# Patient Record
Sex: Male | Born: 1975 | Race: Black or African American | Hispanic: No | Smoking: Current every day smoker
Health system: Southern US, Community
[De-identification: ages and names within clinical notes are randomized; demographics above are authoritative.]

## PROBLEM LIST (undated history)

## (undated) DIAGNOSIS — R51 Headache: Secondary | ICD-10-CM

## (undated) DIAGNOSIS — I10 Essential (primary) hypertension: Secondary | ICD-10-CM

## (undated) DIAGNOSIS — R519 Headache, unspecified: Secondary | ICD-10-CM

## (undated) HISTORY — DX: Headache: R51

## (undated) HISTORY — DX: Essential (primary) hypertension: I10

## (undated) HISTORY — DX: Headache, unspecified: R51.9

---

## 2000-01-28 ENCOUNTER — Emergency Department (HOSPITAL_COMMUNITY): Admission: EM | Admit: 2000-01-28 | Discharge: 2000-01-28 | Payer: Self-pay | Admitting: Emergency Medicine

## 2001-11-07 ENCOUNTER — Encounter: Payer: Self-pay | Admitting: Emergency Medicine

## 2001-11-07 ENCOUNTER — Emergency Department (HOSPITAL_COMMUNITY): Admission: EM | Admit: 2001-11-07 | Discharge: 2001-11-07 | Payer: Self-pay | Admitting: Emergency Medicine

## 2002-02-20 ENCOUNTER — Emergency Department (HOSPITAL_COMMUNITY): Admission: EM | Admit: 2002-02-20 | Discharge: 2002-02-20 | Payer: Self-pay | Admitting: Emergency Medicine

## 2011-11-04 ENCOUNTER — Other Ambulatory Visit: Payer: Self-pay

## 2011-11-04 ENCOUNTER — Emergency Department (HOSPITAL_COMMUNITY): Payer: PRIVATE HEALTH INSURANCE

## 2011-11-04 ENCOUNTER — Emergency Department (HOSPITAL_COMMUNITY)
Admission: EM | Admit: 2011-11-04 | Discharge: 2011-11-04 | Disposition: A | Discharge status: Home / Self Care | Payer: PRIVATE HEALTH INSURANCE | Attending: Emergency Medicine | Admitting: Emergency Medicine

## 2011-11-04 ENCOUNTER — Encounter (HOSPITAL_COMMUNITY): Payer: Self-pay | Admitting: *Deleted

## 2011-11-04 DIAGNOSIS — R209 Unspecified disturbances of skin sensation: Secondary | ICD-10-CM | POA: Insufficient documentation

## 2011-11-04 DIAGNOSIS — R29898 Other symptoms and signs involving the musculoskeletal system: Secondary | ICD-10-CM | POA: Insufficient documentation

## 2011-11-04 DIAGNOSIS — E876 Hypokalemia: Secondary | ICD-10-CM | POA: Insufficient documentation

## 2011-11-04 DIAGNOSIS — F17200 Nicotine dependence, unspecified, uncomplicated: Secondary | ICD-10-CM | POA: Insufficient documentation

## 2011-11-04 DIAGNOSIS — R202 Paresthesia of skin: Secondary | ICD-10-CM

## 2011-11-04 DIAGNOSIS — R51 Headache: Secondary | ICD-10-CM

## 2011-11-04 DIAGNOSIS — M79609 Pain in unspecified limb: Secondary | ICD-10-CM | POA: Insufficient documentation

## 2011-11-04 LAB — BASIC METABOLIC PANEL
BUN: 13 mg/dL (ref 6–23)
CO2: 28 mEq/L (ref 19–32)
Calcium: 9.3 mg/dL (ref 8.4–10.5)
Chloride: 104 mEq/L (ref 96–112)
Creatinine, Ser: 1.23 mg/dL (ref 0.50–1.35)
GFR calc Af Amer: 87 mL/min — ABNORMAL LOW (ref >90)
GFR calc non Af Amer: 75 mL/min — ABNORMAL LOW (ref >90)
Glucose, Bld: 94 mg/dL (ref 70–99)
Potassium: 3.3 mEq/L — ABNORMAL LOW (ref 3.5–5.1)
Sodium: 140 mEq/L (ref 135–145)

## 2011-11-04 LAB — RAPID URINE DRUG SCREEN, HOSP PERFORMED
Amphetamines: NOT DETECTED
Barbiturates: NOT DETECTED
Benzodiazepines: NOT DETECTED
Tetrahydrocannabinol: NOT DETECTED

## 2011-11-04 LAB — CBC
HCT: 44.1 % (ref 39.0–52.0)
Hemoglobin: 15.2 g/dL (ref 13.0–17.0)
MCH: 29 pg (ref 26.0–34.0)
MCV: 84 fL (ref 78.0–100.0)
RBC: 5.25 MIL/uL (ref 4.22–5.81)
WBC: 5.6 10*3/uL (ref 4.0–10.5)

## 2011-11-04 LAB — CARDIAC PANEL(CRET KIN+CKTOT+MB+TROPI)
CK, MB: 3.4 ng/mL (ref 0.3–4.0)
Relative Index: 1.3 (ref 0.0–2.5)
Total CK: 272 U/L — ABNORMAL HIGH (ref 7–232)
Troponin I: <0.3 ng/mL (ref <0.30)

## 2011-11-04 MED ORDER — ACETAMINOPHEN 325 MG PO TABS
650.0000 mg | ORAL_TABLET | Freq: Once | ORAL | Status: AC
  Administered 2011-11-04: 650 mg via ORAL
  Filled 2011-11-04: qty 2

## 2011-11-04 MED ORDER — POTASSIUM CHLORIDE CRYS ER 20 MEQ PO TBCR
40.0000 meq | EXTENDED_RELEASE_TABLET | Freq: Once | ORAL | Status: AC
  Administered 2011-11-04: 40 meq via ORAL
  Filled 2011-11-04: qty 2

## 2011-11-04 NOTE — Discharge Instructions (Signed)
Foods Rich in Potassium The body needs potassium to:  Control blood pressure.   Keep the muscles healthy.   Keep the nervous system healthy.  Most foods contain potassium. Eating a variety of foods in the right amounts will help control the level of potassium in your body.  Food / Potassium (mg)  Apricots, dried,  cup / 378 mg   Apricots, raw, 1 cup halves / 401 mg   Avocado,  / 487 mg   Banana, 1 large / 487 mg   Beef, lean, round, 3 oz / 202 mg   Cantaloupe, 1 cup cubes / 427 mg   Dates, medjool, 5 whole / 835 mg   Ham, cured, 3 oz / 212 mg   Lentils, dried,  cup / 458 mg   Lima beans, frozen,  cup / 258 mg   Orange, 1 large / 333 mg   Orange juice, 1 cup / 443 mg   Peaches, dried,  cup / 398 mg   Peas, split, cooked,  cup / 355 mg   Potato, boiled, 1 medium / 515 mg   Prunes, dried, uncooked,  cup / 318 mg   Raisins,  cup / 309 mg   Salmon, pink, raw, 3 oz / 275 mg   Sardines, canned , 3 oz / 338 mg   Tomato, raw, 1 medium / 292 mg   Tomato juice, 6 oz / 417 mg   [place], 3 oz / 349 mg  Other Foods High in Potassium (greater than 250 mg):  Bran cereals and other bran products.   Milk (skim, 1%, 2%, whole).   Buttermilk.   Yogurt.   Nuts.   Dried fruits.   Cherries.   Sweet potatoes.   Oranges.   Baked Beans.   Broccoli.   Spinach.   Peanut butter.   Tofu.  Foods Lower in Potassium (less than 250 mg):  Pasta.   Rice.   Cottage cheese.   Cheddar cheese.   Apples.   Mango.   Grapes.   Grapefruit.   Pineapple.   Raspberries.   Strawberries.   Watermelon.   Green Beans.   Cabbage.   Carrots.   Cauliflower.   Celery.   Corn.    Mushrooms.   Onions.   Squash.   Eggs.  The list below tells you how big or small some common portion sizes are:  1 oz.........4 stacked dice.   3 oz........Marland KitchenDeck of cards.   1 tsp.......Marland KitchenTip of little finger.   1 tbs......Marland KitchenMarland KitchenThumb.   2 tbs.......Marland KitchenGolf  ball.    cup......Marland KitchenHalf of a fist.   1 cup.......Marland KitchenA fist.  Document Released: 02/25/2008 Document Revised: 05/21/2011 Document Reviewed: 01/22/2009 [facility] Patient Information 2012 [place], [place].    Headaches, Frequently Asked Questions MIGRAINE HEADACHES Q: What is migraine? What causes it? How can I treat it? A: Generally, migraine headaches begin as a dull ache. Then they develop into a constant, throbbing, and pulsating pain. You may experience pain at the temples. You may experience pain at the front or back of one or both sides of the head. The pain is usually accompanied by a combination of:  Nausea.   Vomiting.   Sensitivity to light and noise.  Some people (about 15%) experience an aura (see below) before an attack. The cause of migraine is believed to be chemical reactions in the brain. Treatment for migraine may include over-the-counter or prescription medications. It may also include self-help techniques. These include relaxation training and biofeedback.  Q:  What is an aura? A: About 15% of people with migraine get an "aura". This is a sign of neurological symptoms that occur before a migraine headache. You may see wavy or jagged lines, dots, or flashing lights. You might experience tunnel vision or blind spots in one or both eyes. The aura can include visual or auditory hallucinations (something imagined). It may include disruptions in smell (such as strange odors), taste or touch. Other symptoms include:  Numbness.   A "pins and needles" sensation.   Difficulty in recalling or speaking the correct word.  These neurological events may last as long as 60 minutes. These symptoms will fade as the headache begins. Q: What is a trigger? A: Certain physical or environmental factors can lead to or "trigger" a migraine. These include:  Foods.   Hormonal changes.   Weather.   Stress.  It is important to remember that triggers are different for everyone. To help  prevent migraine attacks, you need to figure out which triggers affect you. Keep a headache diary. This is a good way to track triggers. The diary will help you talk to your healthcare professional about your condition. Q: Does weather affect migraines? A: Bright sunshine, hot, humid conditions, and drastic changes in barometric pressure may lead to, or "trigger," a migraine attack in some people. But studies have shown that weather does not act as a trigger for everyone with migraines. Q: What is the link between migraine and hormones? A: Hormones start and regulate many of your body's functions. Hormones keep your body in balance within a constantly changing environment. The levels of hormones in your body are unbalanced at times. Examples are during menstruation, pregnancy, or menopause. That can lead to a migraine attack. In fact, about three quarters of all women with migraine report that their attacks are related to the menstrual cycle.  Q: Is there an increased risk of stroke for migraine sufferers? A: The likelihood of a migraine attack causing a stroke is very remote. That is not to say that migraine sufferers cannot have a stroke associated with their migraines. In persons under age 67, the most common associated factor for stroke is migraine headache. But over the course of a person's normal life span, the occurrence of migraine headache may actually be associated with a reduced risk of dying from cerebrovascular disease due to stroke.  Q: What are acute medications for migraine? A: Acute medications are used to treat the pain of the headache after it has started. Examples over-the-counter medications, NSAIDs, ergots, and triptans.  Q: What are the triptans? A: Triptans are the newest class of abortive medications. They are specifically targeted to treat migraine. Triptans are vasoconstrictors. They moderate some chemical reactions in the brain. The triptans work on receptors in your brain.  Triptans help to restore the balance of a neurotransmitter called serotonin. Fluctuations in levels of serotonin are thought to be a main cause of migraine.  Q: Are over-the-counter medications for migraine effective? A: Over-the-counter, or "OTC," medications may be effective in relieving mild to moderate pain and associated symptoms of migraine. But you should see your caregiver before beginning any treatment regimen for migraine.  Q: What are preventive medications for migraine? A: Preventive medications for migraine are sometimes referred to as "prophylactic" treatments. They are used to reduce the frequency, severity, and length of migraine attacks. Examples of preventive medications include antiepileptic medications, antidepressants, beta-blockers, calcium channel blockers, and NSAIDs (nonsteroidal anti-inflammatory drugs). Q: Why are anticonvulsants used to treat  migraine? A: During the past few years, there has been an increased interest in antiepileptic drugs for the prevention of migraine. They are sometimes referred to as "anticonvulsants". Both epilepsy and migraine may be caused by similar reactions in the brain.  Q: Why are antidepressants used to treat migraine? A: Antidepressants are typically used to treat people with depression. They may reduce migraine frequency by regulating chemical levels, such as serotonin, in the brain.  Q: What alternative therapies are used to treat migraine? A: The term "alternative therapies" is often used to describe treatments considered outside the scope of conventional Western medicine. Examples of alternative therapy include acupuncture, acupressure, and yoga. Another common alternative treatment is herbal therapy. Some herbs are believed to relieve headache pain. Always discuss alternative therapies with your caregiver before proceeding. Some herbal products contain arsenic and other toxins. TENSION HEADACHES Q: What is a tension-type headache? What  causes it? How can I treat it? A: Tension-type headaches occur randomly. They are often the result of temporary stress, anxiety, fatigue, or anger. Symptoms include soreness in your temples, a tightening band-like sensation around your head (a "vice-like" ache). Symptoms can also include a pulling feeling, pressure sensations, and contracting head and neck muscles. The headache begins in your forehead, temples, or the back of your head and neck. Treatment for tension-type headache may include over-the-counter or prescription medications. Treatment may also include self-help techniques such as relaxation training and biofeedback. CLUSTER HEADACHES Q: What is a cluster headache? What causes it? How can I treat it? A: Cluster headache gets its name because the attacks come in groups. The pain arrives with little, if any, warning. It is usually on one side of the head. A tearing or bloodshot eye and a runny nose on the same side of the headache may also accompany the pain. Cluster headaches are believed to be caused by chemical reactions in the brain. They have been described as the most severe and intense of any headache type. Treatment for cluster headache includes prescription medication and oxygen. SINUS HEADACHES Q: What is a sinus headache? What causes it? How can I treat it? A: When a cavity in the bones of the face and skull (a sinus) becomes inflamed, the inflammation will cause localized pain. This condition is usually the result of an allergic reaction, a tumor, or an infection. If your headache is caused by a sinus blockage, such as an infection, you will probably have a fever. An x-ray will confirm a sinus blockage. Your caregiver's treatment might include antibiotics for the infection, as well as antihistamines or decongestants.  REBOUND HEADACHES Q: What is a rebound headache? What causes it? How can I treat it? A: A pattern of taking acute headache medications too often can lead to a condition  known as "rebound headache." A pattern of taking too much headache medication includes taking it more than 2 days per week or in excessive amounts. That means more than the label or a caregiver advises. With rebound headaches, your medications not only stop relieving pain, they actually begin to cause headaches. Doctors treat rebound headache by tapering the medication that is being overused. Sometimes your caregiver will gradually substitute a different type of treatment or medication. Stopping may be a challenge. Regularly overusing a medication increases the potential for serious side effects. Consult a caregiver if you regularly use headache medications more than 2 days per week or more than the label advises. ADDITIONAL QUESTIONS AND ANSWERS Q: What is biofeedback? A: Biofeedback is  a self-help treatment. Biofeedback uses special equipment to monitor your body's involuntary physical responses. Biofeedback monitors:  Breathing.   Pulse.   Heart rate.   Temperature.   Muscle tension.   Brain activity.  Biofeedback helps you refine and perfect your relaxation exercises. You learn to control the physical responses that are related to stress. Once the technique has been mastered, you do not need the equipment any more. Q: Are headaches hereditary? A: Four out of five (80%) of people that suffer report a family history of migraine. Scientists are not sure if this is genetic or a family predisposition. Despite the uncertainty, a child has a 50% chance of having migraine if one parent suffers. The child has a 75% chance if both parents suffer.  Q: Can children get headaches? A: By the time they reach high school, most young people have experienced some type of headache. Many safe and effective approaches or medications can prevent a headache from occurring or stop it after it has begun.  Q: What type of doctor should I see to diagnose and treat my headache? A: Start with your primary caregiver.  Discuss his or her experience and approach to headaches. Discuss methods of classification, diagnosis, and treatment. Your caregiver may decide to recommend you to a headache specialist, depending upon your symptoms or other physical conditions. Having diabetes, allergies, etc., may require a more comprehensive and inclusive approach to your headache. The National Head[employer] Foundation will provide, upon request, a list of [facility] physician members in your state. Document Released: 11/29/2003 Document Revised: 05/21/2011 Document Reviewed: 05/08/2008 [facility] Patient Information 2012 [place], [place].   Paresthesia Paresthesia refers to an abnormal burning or prickling sensation. This sensation is generally felt in the hands, arms, legs, or feet. It may occur in any part of the body. The sensation arises spontaneously. It can begin without apparent stimulus. It is usually not painful. It may also be described as:  Tingling or numbness.   Skin crawling.   Buzzing.   Itching.  Most people have experienced temporary (transient) paresthesia at some time in their lives. CAUSES  Paresthesia occurs whenever inadvertent pressure is placed on a nerve and causes what many describe as a "pins and needles" feeling. The feeling quickly goes away once the pressure is relieved.  For some people, however, paresthesia can become a chronic condition caused by an underlying disorder. The underlying disorder can be from:  Traumatic and direct injury to nerves:   Broken neck.   Fractured skull.   Disorders affecting the central nervous system (the brain and spinal cord), such as:   Transverse myelitis.   Encephalitis.   Transient ischemic attack.   Multiple sclerosis.   Stroke.   A tumor or blood vessel problem such as an arteriovenous malformation pressed up against the brain or spinal cord.   A wide range of conditions can also damage peripheral nerves (peripheral neuropathy). Peripheral nerves are  nerves that are not part of the brain and spinal cord. These conditions include:   Diabetes.   Hypothyroidism.   Vitamin B12 deficiencies.   Alcoholism.   Heavy metal poisoning (lead, arsenic, and other metals).   Nerve entrapment syndromes, such as carpal tunnel syndrome.   Rheumatoid arthritis.   Systemic lupus erythematosus.  Paresthesia caused by peripheral neuropathy may be accompanied by pain. DIAGNOSIS  Diagnosis is based on determining the underlying condition causing the paresthetic sensations. Essential to the diagnosis are an individual's:  Medical history.   Physical examination.  Laboratory tests.  Your caregiver may order additional tests depending on the suspected cause of the paresthesia. TREATMENT  The appropriate treatment for paresthesia depends on accurate diagnosis of the underlying cause. PROGNOSIS  The prognosis for those with paresthesia depends on the severity of the sensations and the associated disorder(s). Document Released: 08/29/2002 Document Revised: 03/24/2011 Document Reviewed: 09/08/2005 [facility] Patient Information 2012 [place], [place].

## 2011-11-04 NOTE — ED Notes (Signed)
Pt reports numbness to left arm since this am, headache since yesterday (generalized). No facial droop, no arm drift, no slurred speech. Pt alert and oriented x 3. Vital signs stable.

## 2011-11-04 NOTE — ED Provider Notes (Cosign Needed)
History     CSN: [id number]  Arrival date & time 11/04/11  1026   First MD Initiated Contact with Patient 11/04/11 1204      Chief Complaint  Patient presents with  . Headache  . Arm Pain   patient does have a known history of asthma. He states he left work late last night, at 1:30 AM at that time. He began having what he described as a severe cramp in his left lower extremity. He then began having pains in his left upper extremity. He then began feeling tingling and weakness in those areas as well. The patient went home and tried to sleep, but he states he awoke feeling "hot" he then noted. Persistent headache in the right portion of his head which is described as throbbing, which apparently began with gradual onset. He has had no facial droop. No slurred speech. He has had no fever or neck pain. He denies any visual changes. He does note chronic "leg cramps." Over the past month. He's had no syncope and no recent illness or injury  (Consider location/radiation/quality/duration/timing/severity/associated sxs/prior treatment) HPI  Past Medical History  Diagnosis Date  . Asthma     History reviewed. No pertinent past surgical history.  No family history on file.  History  Substance Use Topics  . Smoking status: Current Everyday Smoker -- 1.0 packs/day    Types: Cigarettes  . Smokeless tobacco: Not on file  . Alcohol Use: Yes     drinks on weekends      Review of Systems  All other systems reviewed and are negative.    Allergies  Review of patient's allergies indicates no known allergies.  Home Medications  No current outpatient prescriptions on file.  BP 165/63  Pulse 106  Temp(Src) 98.2 F (36.8 C) (Oral)  Resp 16  Ht 5\' 8"  (1.727 m)  Wt 220 lb (99.791 kg)  BMI 33.45 kg/m2  SpO2 98%  Physical Exam  Nursing note and vitals reviewed. Constitutional: He is oriented to person, place, and time. He appears well-developed and well-nourished.  HENT:  Head:  Normocephalic and atraumatic.  Eyes: Conjunctivae and EOM are normal. Pupils are equal, round, and reactive to light.  Neck: Neck supple.  Cardiovascular: Normal rate and regular rhythm.  Exam reveals no gallop and no friction rub.   No murmur heard. Pulmonary/Chest: Breath sounds normal. He has no wheezes. He has no rales. He exhibits no tenderness.  Abdominal: Soft. Bowel sounds are normal. He exhibits no distension. There is no tenderness. There is no rebound and no guarding.  Musculoskeletal: Normal range of motion.  Neurological: He is alert and oriented to person, place, and time. No cranial nerve deficit. He exhibits normal muscle tone. Coordination normal.       Grip strength slightly decreased on the left.  Poor effort in grip strength. However, overall, appears to be normal There is no pronator drift, however, patient complains of pain in his left upper shoulder. When holding his arms outward and extended. Reflexes are equal and symmetric. No facial droop. Tongue protrudes the midline. Speech is fluent. Gait is normal as tested Finger to nose testing is equal on both sides and normal  Skin: Skin is warm and dry. No rash noted.  Psychiatric: He has a normal mood and affect.    ED Course  Procedures (including critical care time)   Labs Reviewed  CARDIAC PANEL(CRET KIN+CKTOT+MB+TROPI)  BASIC METABOLIC PANEL  CBC  URINE RAPID DRUG SCREEN (HOSP PERFORMED)  No results found.   No diagnosis found.    MDM  Patient is seen and examined, initial history and physical is completed. Evaluation initiated    Workup to include labs, chest x-ray, EKG, stat head CT for symptoms   Results for orders placed during the hospital encounter of 11/04/11  CARDIAC PANEL(CRET KIN+CKTOT+MB+TROPI)      Component Value Range   Total CK 272 (*) 7 - 232 (U/L)   CK, MB 3.4  0.3 - 4.0 (ng/mL)   Troponin I <0.30  <0.30 (ng/mL)   Relative Index 1.3  0.0 - 2.5   BASIC METABOLIC PANEL       Component Value Range   Sodium 140  135 - 145 (mEq/L)   Potassium 3.3 (*) 3.5 - 5.1 (mEq/L)   Chloride 104  96 - 112 (mEq/L)   CO2 28  19 - 32 (mEq/L)   Glucose, Bld 94  70 - 99 (mg/dL)   BUN 13  6 - 23 (mg/dL)   Creatinine, Ser 1.61  0.50 - 1.35 (mg/dL)   Calcium 9.3  8.4 - 09.6 (mg/dL)   GFR calc non Af Amer 75 (*) >90 (mL/min)   GFR calc Af Amer 87 (*) >90 (mL/min)  CBC      Component Value Range   WBC 5.6  4.0 - 10.5 (K/uL)   RBC 5.25  4.22 - 5.81 (MIL/uL)   Hemoglobin 15.2  13.0 - 17.0 (g/dL)   HCT 04.5  40.9 - 81.1 (%)   MCV 84.0  78.0 - 100.0 (fL)   MCH 29.0  26.0 - 34.0 (pg)   MCHC 34.5  30.0 - 36.0 (g/dL)   RDW 91.4  78.2 - 95.6 (%)   Platelets 129 (*) 150 - 400 (K/uL)  URINE RAPID DRUG SCREEN (HOSP PERFORMED)      Component Value Range   Opiates NONE DETECTED  NONE DETECTED    Cocaine NONE DETECTED  NONE DETECTED    Benzodiazepines NONE DETECTED  NONE DETECTED    Amphetamines NONE DETECTED  NONE DETECTED    Tetrahydrocannabinol NONE DETECTED  NONE DETECTED    Barbiturates NONE DETECTED  NONE DETECTED    Dg Chest 2 View  11/04/2011  *RADIOLOGY REPORT*  Clinical Data: Left arm pain and weakness.  Headache.  CHEST - 2 VIEW  Comparison: No priors.  Findings: Lung volumes are normal.  No consolidative airspace disease.  No pleural effusions.  No pneumothorax.  No pulmonary nodule or mass noted.  Pulmonary vasculature and the cardiomediastinal silhouette are within normal limits.  IMPRESSION: 1. No radiographic evidence of acute cardiopulmonary disease.  Original Report Authenticated By: Florencia Reasons, M.D.   Ct Head Wo Contrast  11/04/2011  *RADIOLOGY REPORT*  Clinical Data: Left-sided arm pain and weakness.  Headaches.  CT HEAD WITHOUT CONTRAST  Technique:  Contiguous axial images were obtained from the base of the skull through the vertex without contrast.  Comparison: No priors.  Findings: No acute intracranial abnormalities.  Specifically, no evidence of acute  infarction, mass, mass effect, hydrocephalus or abnormal intra or extra-axial fluid collections.  No displaced skull fractures.  Visualized paranasal sinuses and mastoids are well pneumatized.  IMPRESSION:  1.  No acute intracranial abnormalities.  The appearance of the brain is normal.  Original Report Authenticated By: Florencia Reasons, M.D.      Other than mild hypokalemia. Other lab tests are normal. CAT scan of the brain was normal. Chest x-ray was normal  Clinically, the patient's symptoms  are not consistent with a stroke. He really has more pain in his left shoulder when he tries to hold out and had some cramping in his left leg. However, he had no focal, motor weakness. He had no slurred speech. No facial droop. The headache was easily relieved with Tylenol. Patient has ambulated in the ED without any difficulty.    Phone consultation with a neurologist, who also agrees that the patient can go home. Patient will be instructed to followup with his primary care doctor this week or to return to the ED for any concerns or changing symptoms         Davinder Haff A. Patrica Duel, MD 11/04/11 1601  Theron Arista A. Patrica Duel, MD 11/04/11 1606

## 2013-03-23 IMAGING — CT CT HEAD W/O CM
2 series · 17 of 30 positions shown, 20 images · non-contrast
Comparison: No priors.

CLINICAL DATA: Left-sided arm pain and weakness.  Headaches.

CT HEAD WITHOUT CONTRAST
TECHNIQUE: Contiguous axial images were obtained from the base of
the skull through the vertex without contrast.

[Series 2: head w/o · axial · non-contrast · 0.43mm/px · z∈[-107,+13]mm · 9 of 31 slices shown, 12 images]
[im 4/31  brain]
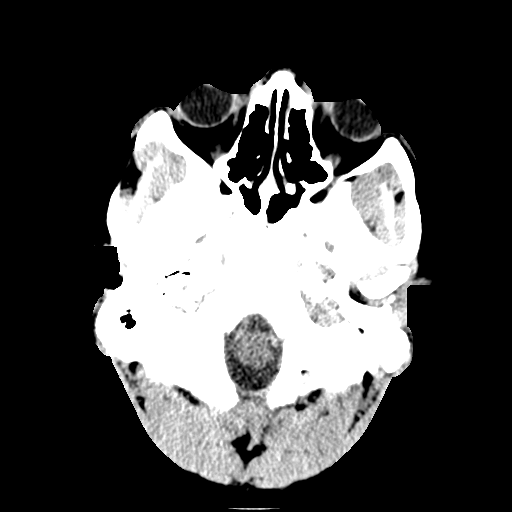
[im 4/31  bone]
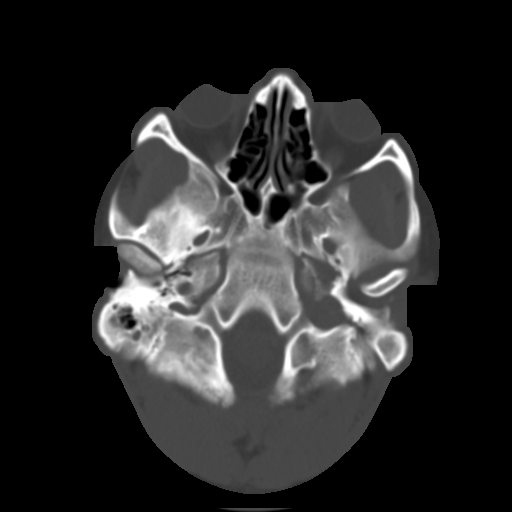
[im 7/31  brain]
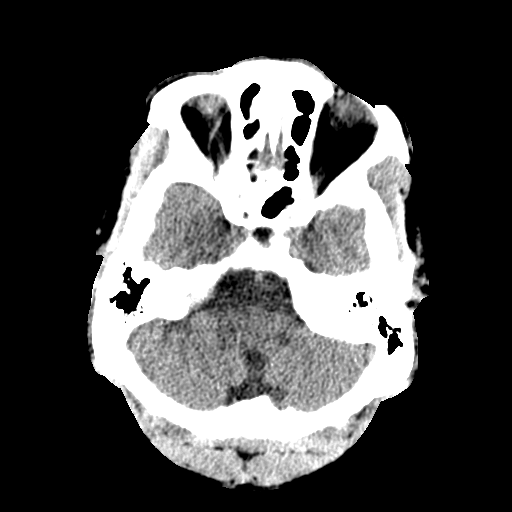
[im 10/31  brain]
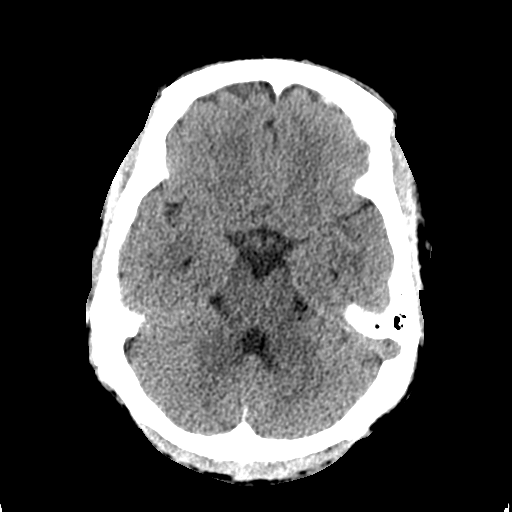
[im 13/31  brain]
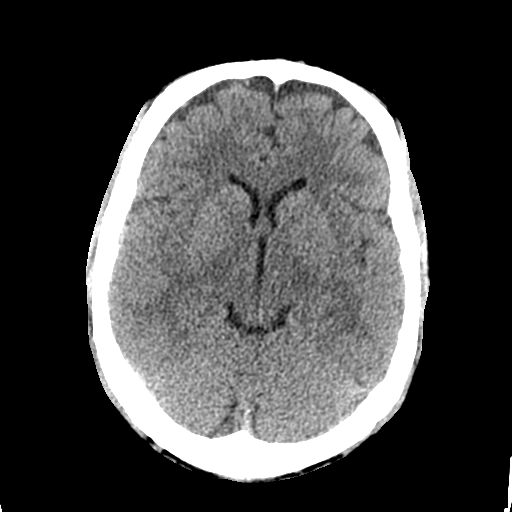
[im 16/31  brain]
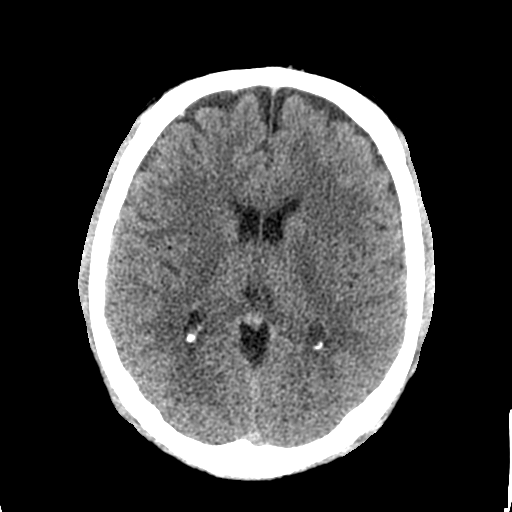
[im 16/31  bone]
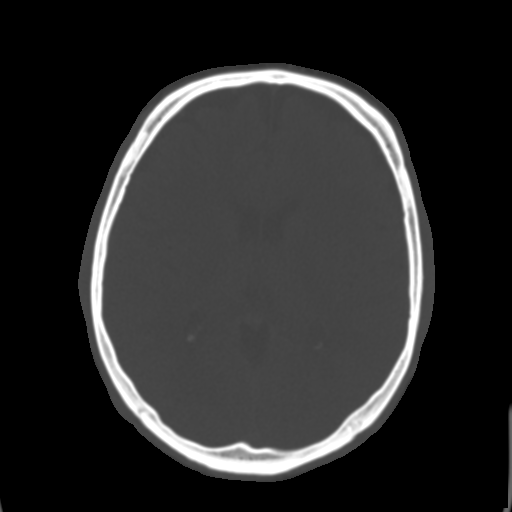
[im 19/31  brain]
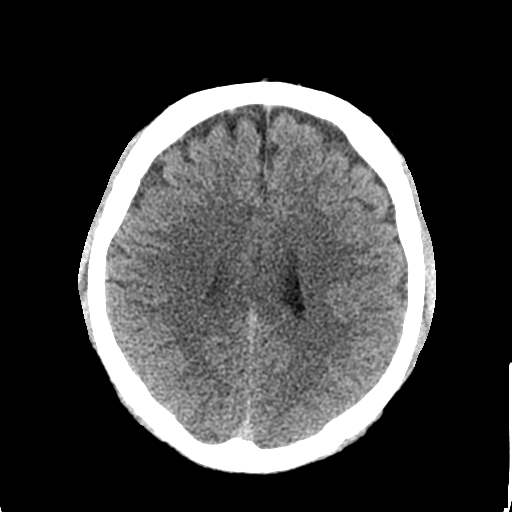
[im 22/31  brain]
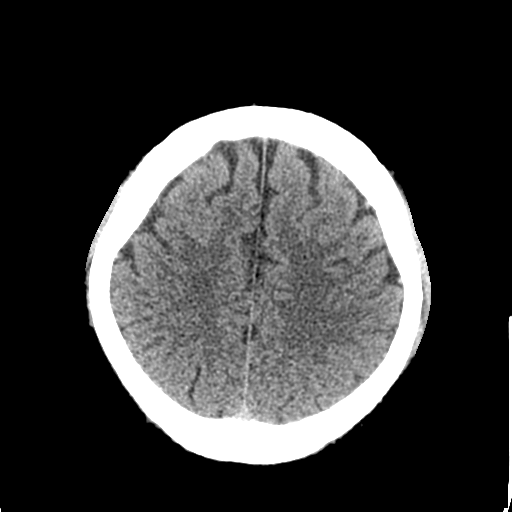
[im 25/31  brain]
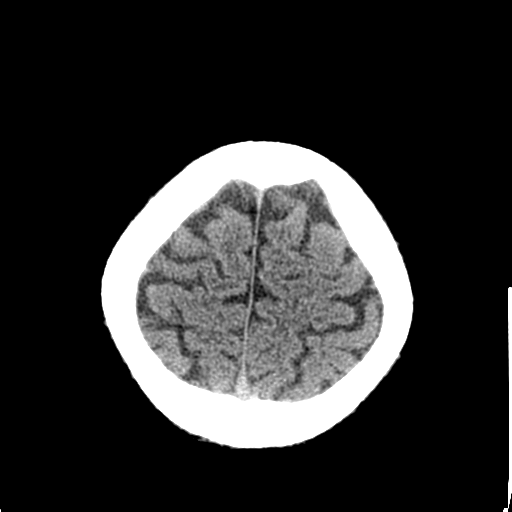
[im 28/31  brain]
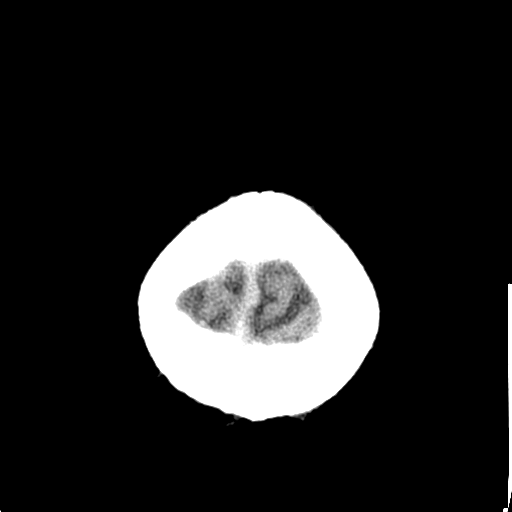
[im 28/31  bone]
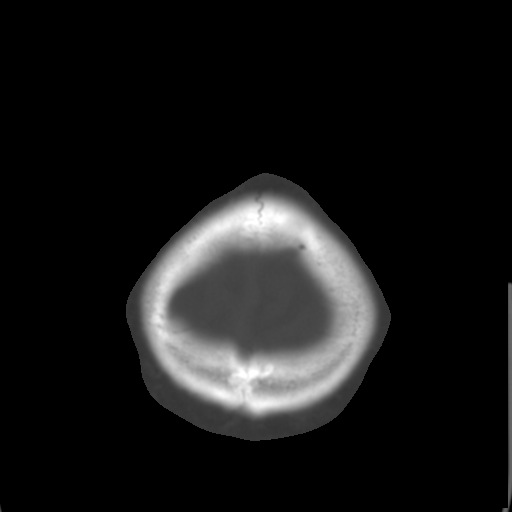

[Series 3: bone windows · axial · 0.43mm/px · z∈[-107,+10]mm · 8 of 51 slices shown]
[im 6/51  bone]
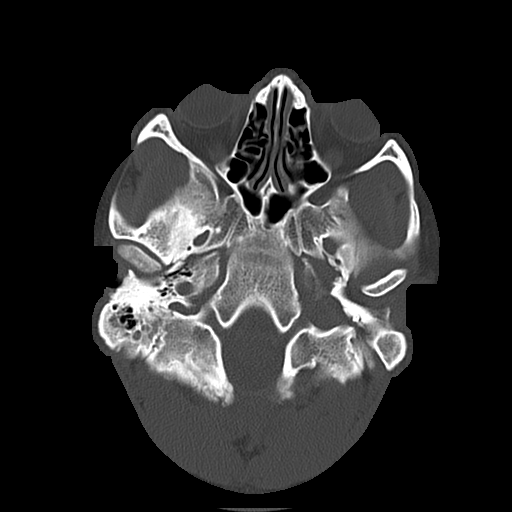
[im 12/51  bone]
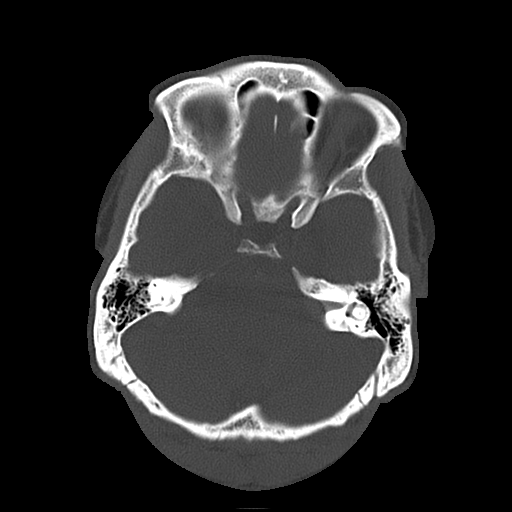
[im 17/51  bone]
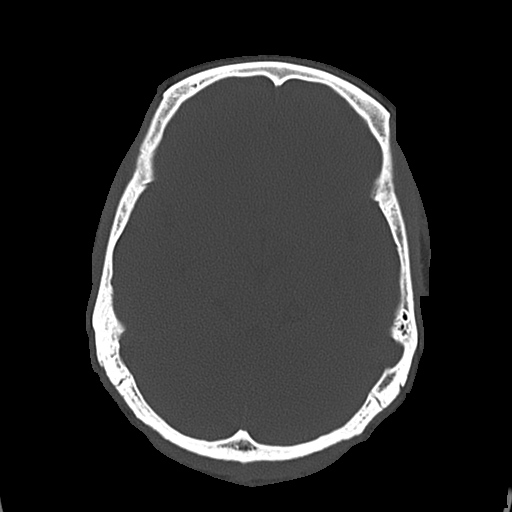
[im 23/51  bone]
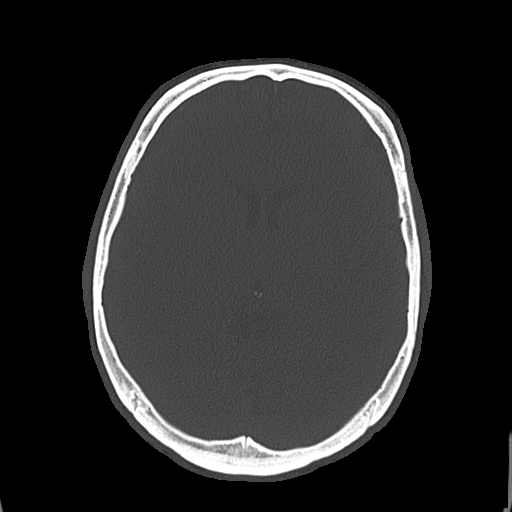
[im 28/51  bone]
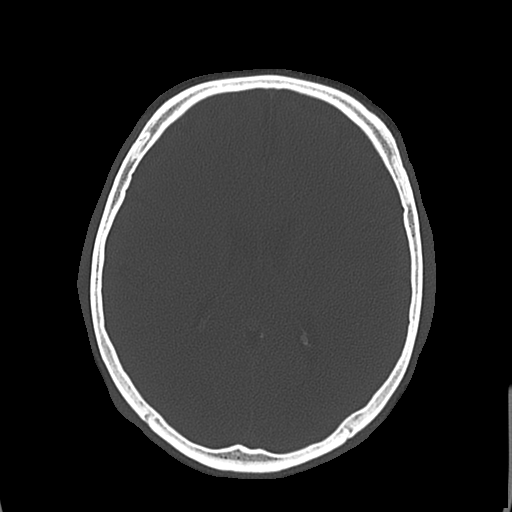
[im 34/51  bone]
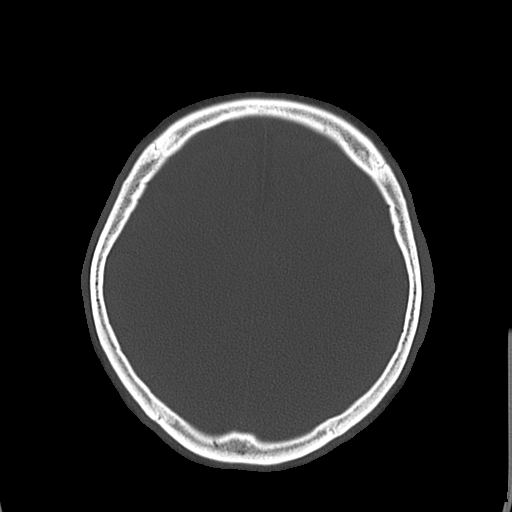
[im 39/51  bone]
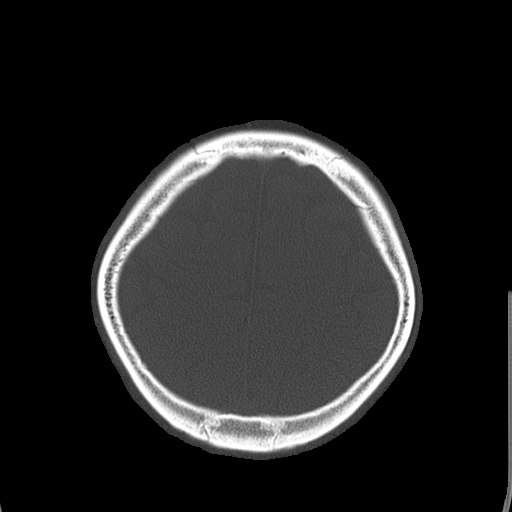
[im 45/51  bone]
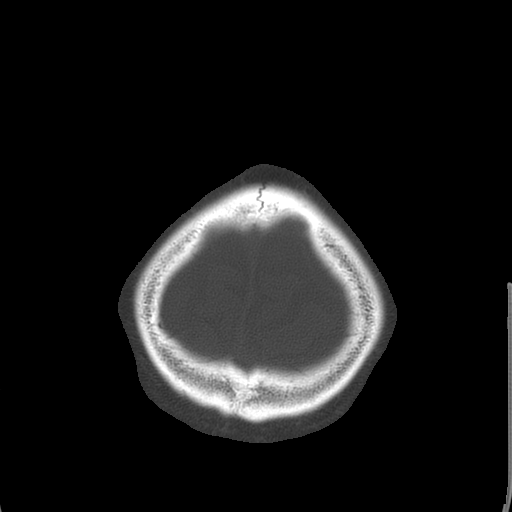

[17 of 30 positions shown; findings below may reference images not displayed]

FINDINGS: No acute intracranial abnormalities.  Specifically, no
evidence of acute infarction, mass, mass effect, hydrocephalus or
abnormal intra or extra-axial fluid collections.  No displaced
skull fractures.  Visualized paranasal sinuses and mastoids are
well pneumatized.
IMPRESSION: 1.  No acute intracranial abnormalities.  The appearance of the
brain is normal.

## 2013-03-23 IMAGING — CR DG CHEST 2V
2 series · 2 of 2 positions shown · non-contrast
Comparison: No priors.

CLINICAL DATA: Left arm pain and weakness.  Headache.

CHEST - 2 VIEW

[w chest pa]
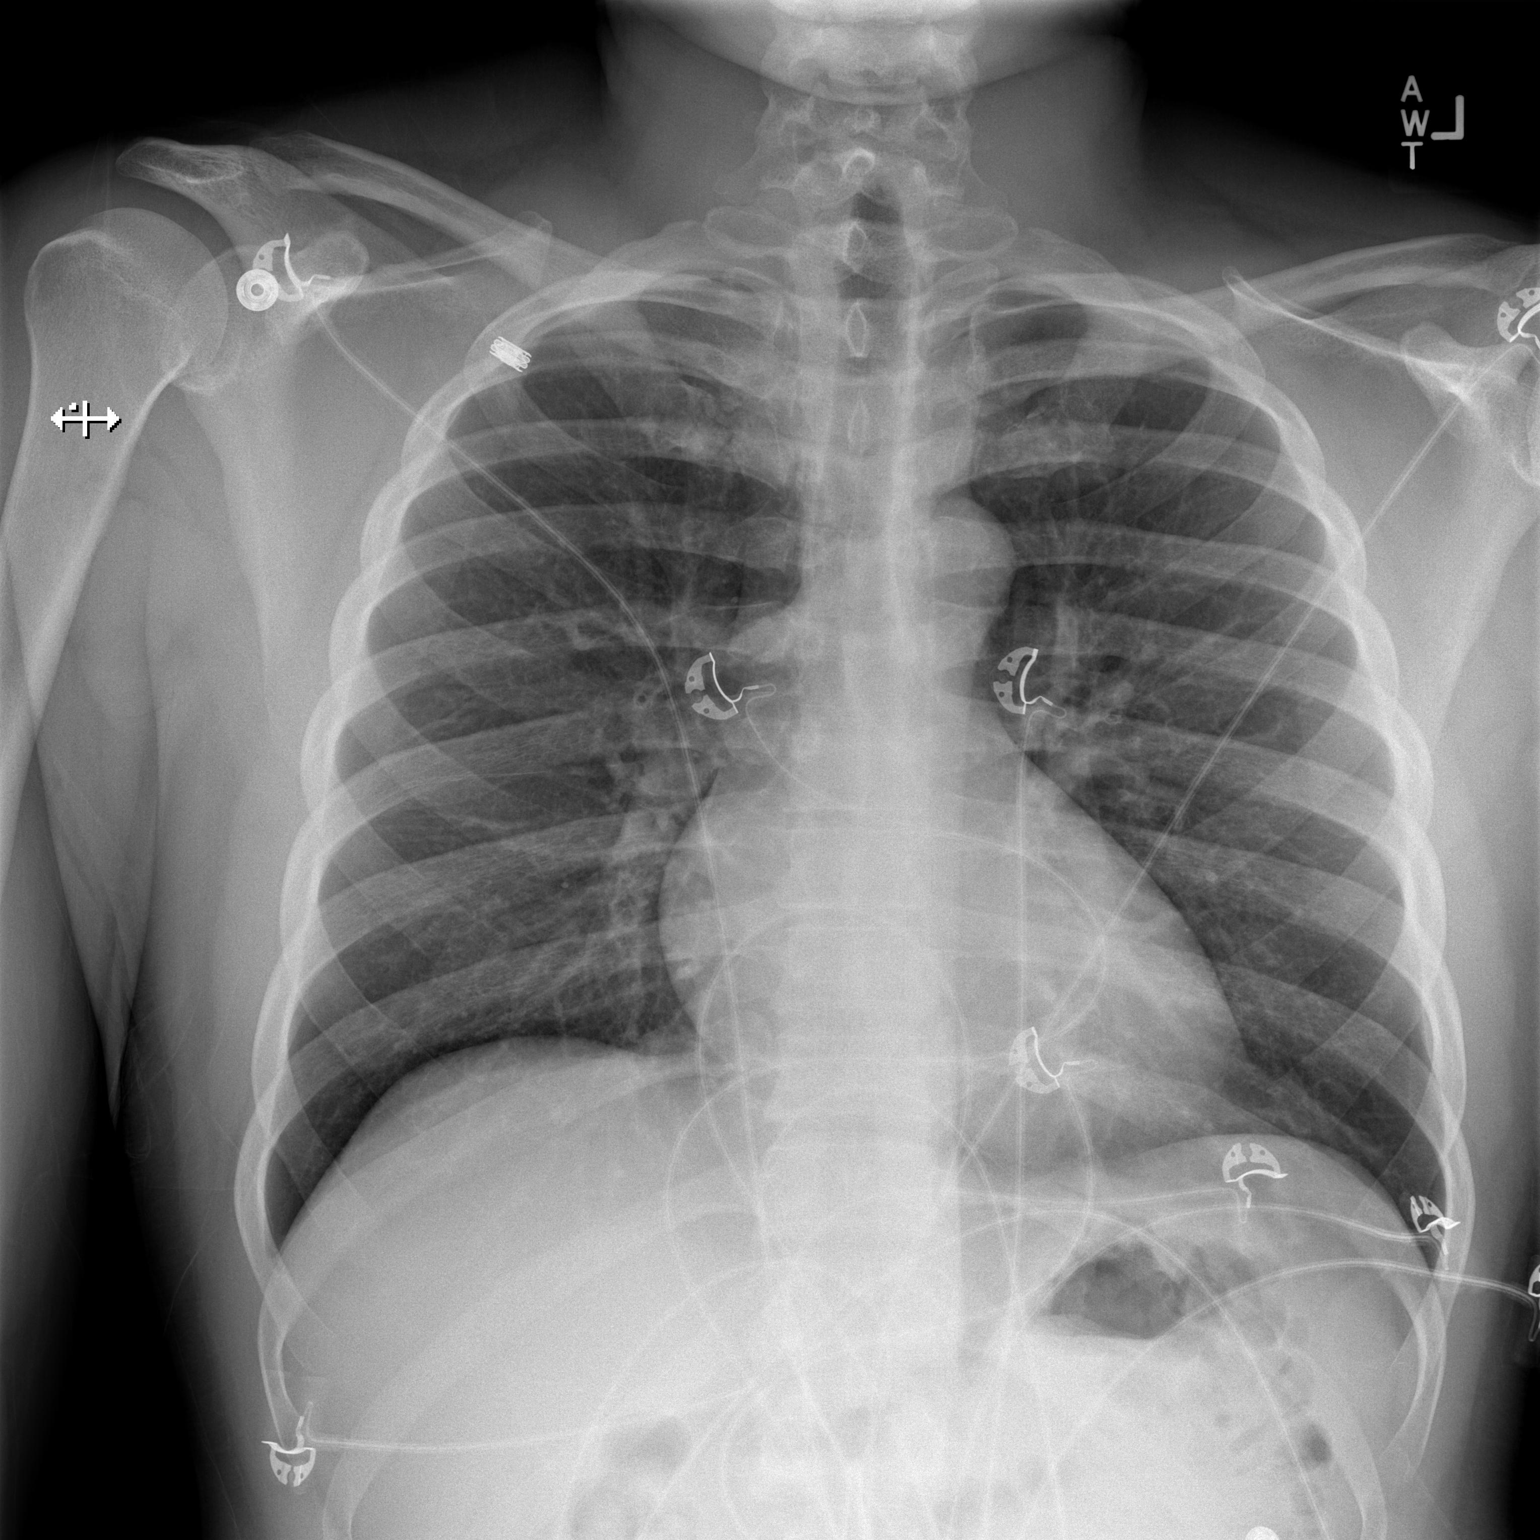

[w chest lat]
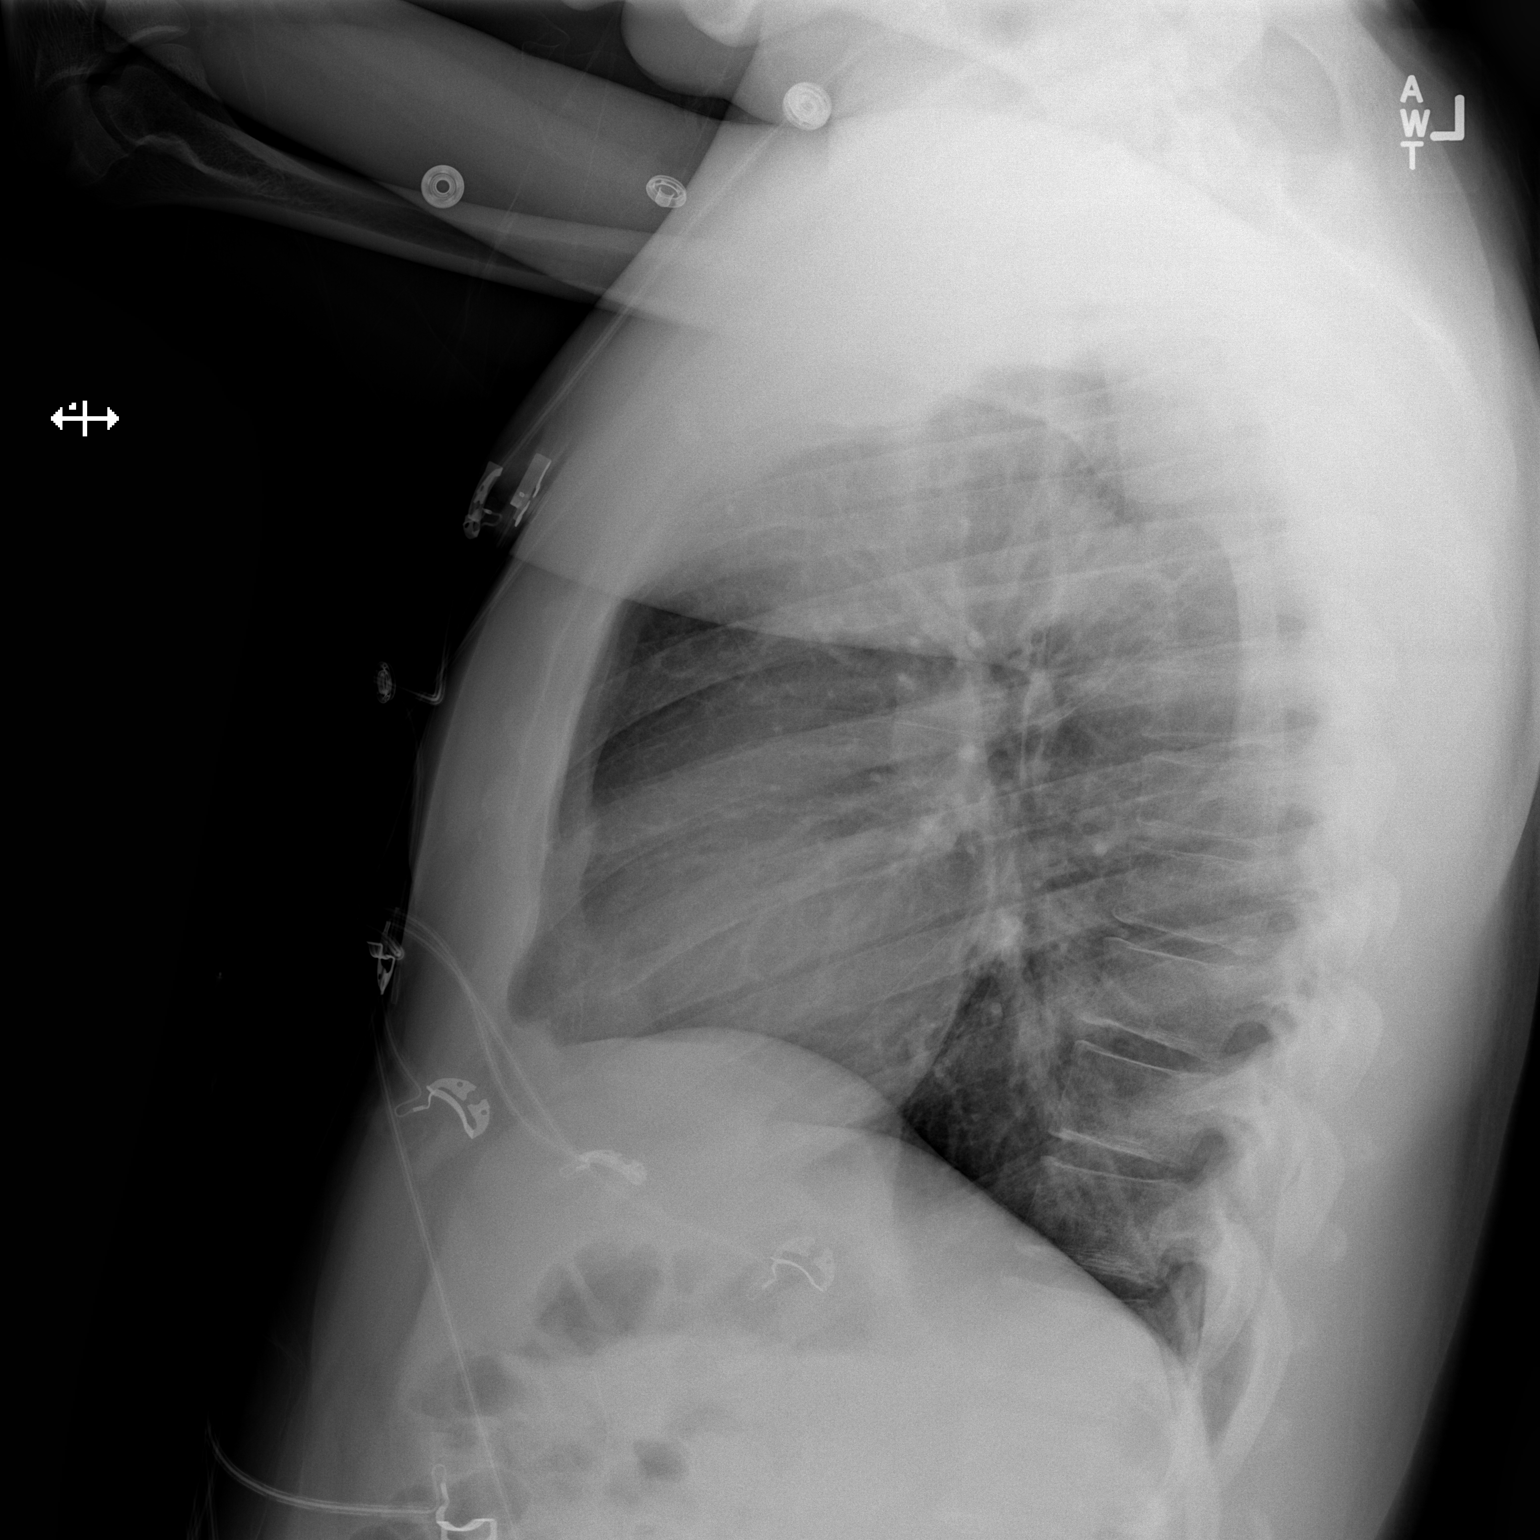

[2 of 2 positions shown; findings below may reference images not displayed]

FINDINGS: Lung volumes are normal.  No consolidative airspace
disease.  No pleural effusions.  No pneumothorax.  No pulmonary
nodule or mass noted.  Pulmonary vasculature and the
cardiomediastinal silhouette are within normal limits.
IMPRESSION: 1. No radiographic evidence of acute cardiopulmonary disease.

## 2016-07-16 ENCOUNTER — Encounter: Payer: Self-pay | Admitting: Diagnostic Neuroimaging

## 2016-07-16 ENCOUNTER — Ambulatory Visit (INDEPENDENT_AMBULATORY_CARE_PROVIDER_SITE_OTHER): Payer: BLUE CROSS/BLUE SHIELD | Admitting: Diagnostic Neuroimaging

## 2016-07-16 VITALS — BP 152/102 | HR 61 | Ht 68.0 in | Wt 249.0 lb

## 2016-07-16 DIAGNOSIS — R51 Headache: Secondary | ICD-10-CM

## 2016-07-16 DIAGNOSIS — G43009 Migraine without aura, not intractable, without status migrainosus: Secondary | ICD-10-CM | POA: Diagnosis not present

## 2016-07-16 DIAGNOSIS — H4710 Unspecified papilledema: Secondary | ICD-10-CM

## 2016-07-16 DIAGNOSIS — R0683 Snoring: Secondary | ICD-10-CM | POA: Diagnosis not present

## 2016-07-16 DIAGNOSIS — Z6837 Body mass index (BMI) 37.0-37.9, adult: Secondary | ICD-10-CM | POA: Diagnosis not present

## 2016-07-16 DIAGNOSIS — G4719 Other hypersomnia: Secondary | ICD-10-CM

## 2016-07-16 DIAGNOSIS — R519 Headache, unspecified: Secondary | ICD-10-CM

## 2016-07-16 DIAGNOSIS — I10 Essential (primary) hypertension: Secondary | ICD-10-CM | POA: Diagnosis not present

## 2016-07-16 MED ORDER — TOPIRAMATE 50 MG PO TABS: 50.0000 mg | ORAL_TABLET | Freq: Two times a day (BID) | ORAL | 12 refills | Status: DC

## 2016-07-16 NOTE — Patient Instructions (Signed)
Thank you for coming to see [place] at [facility]. I hope we have been able to provide you high quality care today.  You may receive a patient satisfaction survey over the next few weeks. We would appreciate your feedback and comments so that we may continue to improve ourselves and the health of our patients.  - I will check MRI brain and sleep study  - start topiramate '50mg'$  at bedtime; after 1 week increase to twice a day; drink plenty of water   ~~~~~~~~~~~~~~~~~~~~~~~~~~~~~~~~~~~~~~~~~~~~~~~~~~~~~~~~~~~~~~~~~  DR. Homar Weinkauf'S GUIDE TO HAPPY AND HEALTHY LIVING These are some of my general health and wellness recommendations. Some of them may apply to you better than others. Please use common sense as you try these suggestions and feel free to ask me any questions.   ACTIVITY/FITNESS Mental, social, emotional and physical stimulation are very important for brain and body health. Try learning a new activity (arts, music, language, sports, games).  Keep moving your body to the best of your abilities. You can do this at home, inside or outside, the park, community center, gym or anywhere you like. Consider a physical therapist or personal trainer to get started. Consider the app Sworkit. Fitness trackers such as smart-watches, smart-phones or Fitbits can help as well.   NUTRITION Eat more plants: colorful vegetables, nuts, seeds and berries.  Eat less sugar, salt, preservatives and processed foods.  Avoid toxins such as cigarettes and alcohol.  Drink water when you are thirsty. Warm water with a slice of lemon is an excellent morning drink to start the day.  Consider these websites for more information The Nutrition Source (https://www.henry-hernandez.biz/) Precision Nutrition (WindowBlog.ch)   RELAXATION Consider practicing mindfulness meditation or other relaxation techniques such as deep breathing, prayer, yoga, tai chi,  massage. See website mindful.org or the apps Headspace or Calm to help get started.   SLEEP Try to get at least 7-8+ hours sleep per day. Regular exercise and reduced caffeine will help you sleep better. Practice good sleep hygeine techniques. See website sleep.org for more information.   PLANNING Prepare estate planning, living will, healthcare POA documents. Sometimes this is best planned with the help of an attorney. Theconversationproject.org and agingwithdignity.org are excellent resources.

## 2016-07-16 NOTE — Progress Notes (Signed)
[facility]  PATIENT: Leonard Carter Carter DOB: 07/23/1976  REFERRING CLINICIAN: Allene Carter, OD HISTORY FROM: patient  REASON FOR VISIT: new consult    HISTORICAL  CHIEF COMPLAINT:  Chief Complaint  Patient presents with  . Bilateral optic disk edema, papilledema    rm 6, New Pt, "blurry vision x 1 year"    HISTORY OF PRESENT ILLNESS:   40 year old right-handed male here for valuation of papilledema and blurred vision. For past 1 year patient has had gradual and progressive blurred vision. Symptoms are worse lately. He also is having increasing headaches in the middle of his head, throbbing sensation with photophobia. Headaches can last hours a time. Patient has tried ibuprofen without relief. Headaches were once per month and now once per week.  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  Patient also has had weight gain from 210 up to 245 pounds over the past 5 years.   REVIEW OF SYSTEMS: Full 14 system review of systems performed and negative with exception of: Swelling in legs itching snoring shortness of breath blurred vision increased thirst joint pain aching muscles skin sensitivity headache numbness.   ALLERGIES: No Known Allergies  HOME MEDICATIONS: No outpatient prescriptions prior to visit.   No facility-administered medications prior to visit.     PAST MEDICAL HISTORY: Past Medical History:  Diagnosis Date  . Asthma     PAST SURGICAL HISTORY: History reviewed. No pertinent surgical history.  FAMILY HISTORY: Family History  Problem Relation Age of Onset  . Ulcers Father     SOCIAL HISTORY:  Social History   Social History  . Marital status: Married    Spouse name: N/A  . Number of children: 4  . Years of education: 15   Occupational History  .      Leonard Carter [employer]   Social History Main Topics  . Smoking status: Current Every Day Smoker    Packs/day: 1.00    Types:  Cigarettes  . Smokeless tobacco: Never Used     Comment: 07/16/16 down to 1/2 PPD  . Alcohol use Yes     Comment: drinks on weekends  . Drug use:     Types: Marijuana     Comment: 07/16/16 quit 3-4 yrs ago, did smoke recently on vacation  . Sexual activity: Not on file   Other Topics Concern  . Not on file   Social History Narrative  . No narrative on file     PHYSICAL EXAM  GENERAL EXAM/CONSTITUTIONAL: Vitals:  Vitals:   07/16/16 1027  BP: (!) 152/102  Pulse: 61  Weight: 249 lb (112.9 kg)  Height: 5\' 8"  (1.727 m)     Body mass index is 37.86 kg/m.  Visual Acuity Screening   Right eye Left eye Both eyes  Without correction: 20/30 20/30   With correction:        Patient is in no distress; well developed, nourished and groomed; neck is supple  CARDIOVASCULAR:  Examination of carotid arteries is normal; no carotid bruits  Regular rate and rhythm, no murmurs  Examination of peripheral vascular system by observation and palpation is normal  EYES:  Ophthalmoscopic exam of optic discs and posterior segments is normal; no hemorrhages  SCLERAL INJECTION (RIGHT GREATER THAN LEFT)  MUSCULOSKELETAL:  Gait, strength, tone, movements noted in Neurologic exam below  NEUROLOGIC: MENTAL STATUS:  No flowsheet data found.  awake, alert, oriented to person, place and time  recent and remote memory intact  normal attention and concentration  language fluent, comprehension intact, naming intact,   fund of knowledge appropriate  CRANIAL NERVE:   2nd - SLIGHTLY BLURRED OPTIC DISC MARGINS on fundoscopic exam  2nd, 3rd, 4th, 6th - pupils equal and reactive to light, visual fields full to confrontation, extraocular muscles intact, no nystagmus  5th - facial sensation symmetric  7th - facial strength symmetric  8th - hearing intact  9th - palate elevates symmetrically, uvula midline  11th - shoulder shrug symmetric  12th - tongue protrusion  midline  MOTOR:   normal bulk and tone, full strength in the BUE, BLE  SENSORY:   normal and symmetric to light touch, pinprick, temperature, vibration  COORDINATION:   finger-nose-finger, fine finger movements normal  REFLEXES:   deep tendon reflexes present and symmetric  GAIT/STATION:   narrow based gait; able to walk on toes, heels and tandem; romberg is negative    DIAGNOSTIC DATA (LABS, IMAGING, TESTING) - I reviewed patient records, labs, notes, testing and imaging myself where available.  Lab Results  Component Value Date   WBC 5.6 11/04/2011   HGB 15.2 11/04/2011   HCT 44.1 11/04/2011   MCV 84.0 11/04/2011   PLT 129 (L) 11/04/2011      Component Value Date/Time   NA 140 11/04/2011 1300   K 3.3 (L) 11/04/2011 1300   CL 104 11/04/2011 1300   CO2 28 11/04/2011 1300   GLUCOSE 94 11/04/2011 1300   BUN 13 11/04/2011 1300   CREATININE 1.23 11/04/2011 1300   CALCIUM 9.3 11/04/2011 1300   GFRNONAA 75 (L) 11/04/2011 1300   GFRAA 87 (L) 11/04/2011 1300   No results found for: CHOL, HDL, LDLCALC, LDLDIRECT, TRIG, CHOLHDL No results found for: ZOXW9UHGBA1C No results found for: VITAMINB12 No results found for: TSH  11/04/11 CT head [I reviewed images myself and agree with interpretation. -VRP]  - No acute intracranial abnormalities.  The appearance of [name] Carter is normal.     ASSESSMENT AND PLAN  40 y.o. year old male here with weight gain, hypertension, blurred vision, papilledema with increasing problems of blurred vision and headaches.   Ddx: pseudotumor cerebri (IIH), CNS vascular/structural, migraine variant, sleep apnea  1. Papilledema   2. Chronic nonintractable headache, unspecified headache type   3. Migraine without aura and without status migrainosus, not intractable   4. Snoring   5. Excessive daytime sleepiness   6. Hypertension, unspecified type   7. BMI 37.0-37.9, adult     PLAN: - check MRI Leonard Carter (rule out secondary causes of  headache) - check sleep study (excessive daytime sleepiness, snoring, hypertension, headaches, obesity; eval for OSA) - start empiric topiramate (can treat for migraine, IIH and obesity)  Orders Placed This Encounter  Procedures  . MR Leonard Carter W WO CONTRAST  . Ambulatory referral to Sleep Studies   Meds ordered this encounter  Medications  . topiramate (TOPAMAX) 50 MG tablet    Sig: Take 1 tablet (50 mg total) by mouth 2 (two) times daily.    Dispense:  60 tablet    Refill:  12   Return in about 6 weeks (around 08/27/2016).  I reviewed images, labs, notes, records myself. I summarized findings and reviewed with patient, for this high risk condition (blurred vision, decreased visual acuity, papilledema, headaches) requiring high complexity decision making.    Suanne MarkerVIKRAM R. PENUMALLI, MD 07/16/2016, 10:41 AM Certified in Neurology, Neurophysiology and Neuroimaging  [facility] [address], [address] [place], [place] [address] [phone]

## 2016-07-21 ENCOUNTER — Telehealth: Payer: Self-pay | Admitting: Diagnostic Neuroimaging

## 2016-07-21 NOTE — Telephone Encounter (Signed)
DR PEN OUT 12/12 PT NEEDS 6 WK FU APPT. PLEASE ADVISE DATE/TIME FOR NEW APPT. PT CANNOT DO MOND OR THURS.

## 2016-07-22 NOTE — Telephone Encounter (Signed)
Ok to push to 8 weeks. -VRP

## 2016-08-05 ENCOUNTER — Ambulatory Visit (INDEPENDENT_AMBULATORY_CARE_PROVIDER_SITE_OTHER): Payer: BLUE CROSS/BLUE SHIELD | Admitting: Neurology

## 2016-08-05 ENCOUNTER — Encounter: Payer: Self-pay | Admitting: Neurology

## 2016-08-05 VITALS — BP 141/93 | HR 84 | Resp 12 | Ht 68.0 in | Wt 238.0 lb

## 2016-08-05 DIAGNOSIS — G4733 Obstructive sleep apnea (adult) (pediatric): Secondary | ICD-10-CM | POA: Diagnosis not present

## 2016-08-05 DIAGNOSIS — R351 Nocturia: Secondary | ICD-10-CM | POA: Diagnosis not present

## 2016-08-05 DIAGNOSIS — E66813 Obesity, class 3: Secondary | ICD-10-CM | POA: Insufficient documentation

## 2016-08-05 DIAGNOSIS — Z6841 Body Mass Index (BMI) 40.0 and over, adult: Secondary | ICD-10-CM | POA: Insufficient documentation

## 2016-08-05 DIAGNOSIS — H4711 Papilledema associated with increased intracranial pressure: Secondary | ICD-10-CM | POA: Diagnosis not present

## 2016-08-05 DIAGNOSIS — R0683 Snoring: Secondary | ICD-10-CM

## 2016-08-05 HISTORY — DX: Obesity, class 3: E66.813

## 2016-08-05 HISTORY — DX: Morbid (severe) obesity due to excess calories: E66.01

## 2016-08-05 NOTE — Patient Instructions (Signed)

## 2016-08-05 NOTE — Progress Notes (Signed)
SLEEP MEDICINE CLINIC   Provider:  Melvyn Novasarmen  Zynasia Burklow, M D  Referring Provider: optometrist/ Dr. Marjory LiesPenumalli  Primary Care Physician:  Ralene OkMOREIRA,ROY, MD  Chief Complaint  Patient presents with  . NP Dr. Gaylord ShihPen execessive daytime sleepiness, snoring, headache,    Rm 11  . sleep consult    HPI:  Leonard Carter is a 40 y.o. male , seen here as a referral/ revisit  from Dr. Marjory LiesPenumalli.    Leonard Carter is a married afro [place] ,right handed male , who has been referred to Dr. Joycelyn SchmidVikram Carter via his optometrist. It was during an eye exam that papilledema was noted. At the same time he had complained about headaches that were not specifically migrainosus or tension headaches that have increased in frequency progressively over the last year. Dr. Dora SimsPamela Maley started him on topiramate hoping that he would support weight loss, headache prophylaxis and intracranial hypertension. A sleep study was ordered as the patient had reported excessive daytime sleepiness, witnessed snoring, a history of hypertension, headaches and obesity with a BMI exceeding 37 making him a high risk patient for the presence of obstructive sleep apnea. Lab tests were available from February -08-2012 and are not relevant for today's evaluation.   Chief complaint according to patient : " if I sit still, I will doze off"   Sleep habits are as follows: He manages a restaurant, and has thereby very irregular work hours. This affects his sleep times. He states that he avoids eating during the day and usually eats when he returns home at night this is his only meal of the day.  The restaurant closes at 11 PM on week days and on the weekends at midnight. That doesn't mean that his work is done, he usually will stay for another hour. He usually arrives home not earlier than 1 AM, prepares for dinner and then goes to bed quickly after. He has been doing this for 20 years, he states.He no longer eats at [employer] as he is tired of the food,  understandably. He is usually in bed and asleep by 2 AM, but over the last couple of months developed nocturia and now has a sleep interrupted by bathroom breaks, 1-2 per night. His wife has noticed that he snores but that the snoring has been progressively louder over the last month and that he stops breathing at times -sounding like he chokes. He does not wake up with headaches but with a very dry, parched mouth.  He rises at 8:30 and will be back at work at 9 AM, spending 13-14 hours daily at work.  Sleep medical history and family sleep history:  " All maternal family members have a CPAP ! "- He has 10 uncles and cousins , all having OSA.  The patient never had his tonsils removed, adenoidectomy, no traumatic Leonard injury no history of neck surgery or ENT procedures. He has no childhood history of sleepwalking, night terrors or enuresis.  Social history: Caffeine ; no sodas, daily tea, no coffee.  6-8 water drinks a day.  ETOH : one day a week, 4-6 drinks. Tobacco use 1/2 pp day, for 18 years.    Dr. Richrd HumblesPenumalli's notes from 07-16-2016 resulted in the following: 40 y.o. year old male here with weight gain, hypertension, blurred vision, papilledema with increasing problems of blurred vision and headaches. Ddx: pseudotumor cerebri (IIH), CNS vascular/structural, migraine variant, sleep apnea  1. Papilledema   2. Chronic nonintractable headache, unspecified headache type   3. Migraine without  aura and without status migrainosus, not intractable   4. Snoring   5. Excessive daytime sleepiness   6. Hypertension, unspecified type   7. BMI 37.0-37.9, adult     Review of Systems: Out of a complete 14 system review, the patient complains of only the following symptoms, and all other reviewed systems are negative. 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 He has experienced weight gain, blurred vision, headaches, increased thirst, louder snoring, coughing, aching  muscles, the feeling of not getting enough sleep, nocturia, headaches, and restless legs at times. He has been feeling as if choking in his sleep and his wife has witnessed snoring and irregular breathing which constitutes apnea.  Epworth score  19 , Fatigue severity score 37  , depression score 1/15   Social History   Social History  . Marital status: Married    Spouse name: N/A  . Number of children: 4  . Years of education: 15   Occupational History  .      [employer]   Social History Main Topics  . Smoking status: Current Every Day Smoker    Packs/day: 0.50    Types: Cigarettes  . Smokeless tobacco: Never Used     Comment: 07/16/16 down to 1/2 PPD  . Alcohol use Yes     Comment: drinks on weekends  . Drug use:     Types: Marijuana     Comment: 07/16/16 quit 3-4 yrs ago, did smoke recently on vacation  . Sexual activity: Not on file   Other Topics Concern  . Not on file   Social History Narrative  . No narrative on file    Family History  Problem Relation Age of Onset  . Ulcers Father     Past Medical History:  Diagnosis Date  . Asthma     History reviewed. No pertinent surgical history.  Current Outpatient Prescriptions  Medication Sig Dispense Refill  . topiramate (TOPAMAX) 50 MG tablet Take 1 tablet (50 mg total) by mouth 2 (two) times daily. 60 tablet 12   No current facility-administered medications for this visit.     Allergies as of 08/05/2016  . (No Known Allergies)    Vitals: BP (!) 141/93   Pulse 84   Resp 12   Ht 5\' 8"  (1.727 m)   Wt 238 lb (108 kg)   BMI 36.19 kg/m  Last Weight:  Wt Readings from Last 1 Encounters:  08/05/16 238 lb (108 kg)   ZOX:WRUE mass index is 36.19 kg/m.     Last Height:   Ht Readings from Last 1 Encounters:  08/05/16 5\' 8"  (1.727 m)    Physical exam:  General: The patient is awake, alert and appears not in acute distress. The patient is well groomed. Head: Normocephalic, atraumatic. Neck is supple.  Mallampati 4- 5 - the uvula cannot be seen.  neck circumference:17.5 . Nasal airflow restrctied ,  Retrognathia is seen.  Cardiovascular:  Regular rate and rhythm, without  murmurs or carotid bruit, and without distended neck veins. Respiratory: Lungs are clear to auscultation. Skin:  Without evidence of edema, or rash Trunk: BMI is elevated  Neurologic exam :The patient is awake and alert, oriented to place and time.   Speech is fluent,  without  dysarthria, dysphonia or aphasia.  Mood and affect are appropriate.  Cranial nerves: Pupils are equal and briskly reactive to light. Funduscopic exam with evidence of mild papilledema ,no vascular abnormalities. Extraocular movements  in  vertical and horizontal planes intact and without nystagmus. Visual fields by finger perimetry are intact. Hearing to finger rub intact. Facial sensation intact to fine touch. Facial motor strength is symmetric and tongue and uvula move midline. Shoulder shrug was symmetrical.   Motor exam:   Normal tone, muscle bulk and symmetric strength in all extremities. Sensory:  Fine touch, pinprick and vibration were tested in all extremities.  Coordination: Finger-to-nose maneuver  normal without evidence of ataxia, dysmetria or tremor. Gait and station: Patient walks without assistive device and is able unassisted to climb up to the exam table. Strength within normal limits.  Stance is stable and normal. Turns with  3 Steps.  Deep tendon reflexes: in the upper and lower extremities are symmetric and intact. Babinski maneuver response is downgoing.  The patient was advised of the nature of the diagnosed sleep disorder, the treatment options and risks for general a health and wellness arising from not treating the condition.  I spent more than 35 minutes of face to face time with the patient. Greater than 50% of time was spent in counseling and coordination of care. We have discussed the diagnosis and differential and I  answered the patient's questions.     Assessment:  After physical and neurologic examination, review of laboratory studies,  Personal review of imaging studies, reports of other /same  Imaging studies ,  Results of polysomnography/ neurophysiology testing and pre-existing records as far as provided in visit., my assessment is   1) Leonard Carter has very likely sleep apnea is indicated in his wife's statements to him. But what is also concerning is that he has sacral of sleep apnea, he is excessively daytime sleepy, he has developed nocturia within the last year, and worsening headaches. Properly edema and idiopathic intracranial hypertension are also related symptoms to a obstructive sleep apnea. For this reason I will order a home sleep test for him. His work hours would not allow him to come at a regular time to the sleep lab. If the sleep test is positive for apnea we will meet and discuss the treatment options. Mild sleep apnea can be treated with a dental device, which also helps to reduce snoring. More severe forms associated with oxygen desaturation usually require treatment with the CPAP machine.  Nasal patency may be an issue for Leonard Carter, as he has often a congested nose. I will ask him to try a saline nasal spray simple salt water to rinse his nostrils and flush rather than a decongestant.   Plan:  Treatment plan and additional workup : HST, Nasal spray , saline, Sleep hygiene discussed.     Porfirio Mylararmen Leilanny Fluitt MD  08/05/2016   CC: Ralene Okoy Moreira, Md [address] [place], [place] [address]

## 2016-08-27 ENCOUNTER — Ambulatory Visit (INDEPENDENT_AMBULATORY_CARE_PROVIDER_SITE_OTHER): Payer: BLUE CROSS/BLUE SHIELD | Admitting: Diagnostic Neuroimaging

## 2016-08-27 ENCOUNTER — Encounter: Payer: Self-pay | Admitting: Diagnostic Neuroimaging

## 2016-08-27 VITALS — BP 133/92 | HR 65 | Ht 68.0 in | Wt 241.2 lb

## 2016-08-27 DIAGNOSIS — G43009 Migraine without aura, not intractable, without status migrainosus: Secondary | ICD-10-CM | POA: Diagnosis not present

## 2016-08-27 DIAGNOSIS — G4719 Other hypersomnia: Secondary | ICD-10-CM

## 2016-08-27 DIAGNOSIS — H4710 Unspecified papilledema: Secondary | ICD-10-CM | POA: Diagnosis not present

## 2016-08-27 DIAGNOSIS — Z6837 Body mass index (BMI) 37.0-37.9, adult: Secondary | ICD-10-CM | POA: Diagnosis not present

## 2016-08-27 MED ORDER — TOPIRAMATE 50 MG PO TABS: 50.0000 mg | ORAL_TABLET | Freq: Two times a day (BID) | ORAL | 12 refills | Status: DC

## 2016-08-27 NOTE — Progress Notes (Signed)
[facility]  PATIENT: Leonard Carter DOB: [birth year]  REFERRING CLINICIAN: Allene Pyo Wood, OD HISTORY FROM: patient  REASON FOR VISIT: follow up   HISTORICAL  CHIEF COMPLAINT:  Chief Complaint  Patient presents with  . Papilledema    rm 7, "no changes in vision noticed"  . Follow-up    6 week    HISTORY OF PRESENT ILLNESS:   UPDATE 08/27/16: Since last visit, tolerating TPX, and no more headaches. Appetite is reduced and he is noting some weight loss. Blurred vision is the same. MRI not done yet. Sleep study setup for 09/10/16.   PRIOR HPI (07/16/16): 40 year old right-handed male here for valuation of papilledema and blurred vision. For past 1 year patient has had gradual and progressive blurred vision. Symptoms are worse lately. He also is having increasing headaches in the middle of his head, throbbing sensation with photophobia. Headaches can last hours a time. Patient has tried ibuprofen without relief. Headaches were once per month and now once per week. 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 Patient also has had weight gain from 210 up to 245 pounds over the past 5 years.   REVIEW OF SYSTEMS: Full 14 system review of systems performed and negative with exception of: light sens blurred vision shortness of breath cough.    ALLERGIES: No Known Allergies  HOME MEDICATIONS: Outpatient Medications Prior to Visit  Medication Sig Dispense Refill  . topiramate (TOPAMAX) 50 MG tablet Take 1 tablet (50 mg total) by mouth 2 (two) times daily. 60 tablet 12   No facility-administered medications prior to visit.     PAST MEDICAL HISTORY: Past Medical History:  Diagnosis Date  . Asthma     PAST SURGICAL HISTORY: History reviewed. No pertinent surgical history.  FAMILY HISTORY: Family History  Problem Relation Age of Onset  . Ulcers Father     SOCIAL HISTORY:  Social History   Social History    . Marital status: Married    Spouse name: N/A  . Number of children: 4  . Years of education: 15   Occupational History  .      [employer]   Social History Main Topics  . Smoking status: Current Every Day Smoker    Packs/day: 0.25    Types: Cigarettes  . Smokeless tobacco: Never Used     Comment: 08/27/16 smoking < 1/2 PPD, trying to quit  . Alcohol use Yes     Comment: drinks on weekends  . Drug use:     Types: Marijuana     Comment: 07/16/16 quit 3-4 yrs ago, did smoke recently on vacation  . Sexual activity: Not on file   Other Topics Concern  . Not on file   Social History Narrative  . No narrative on file     PHYSICAL EXAM  GENERAL EXAM/CONSTITUTIONAL: Vitals:  Vitals:   08/27/16 1035  BP: (!) 133/92  Pulse: 65   Wt Readings from Last 5 Encounters:  08/27/16 241 lb 3.2 oz (109.4 kg)  08/05/16 238 lb (108 kg)  07/16/16 249 lb (112.9 kg)  11/04/11 220 lb (99.8 kg)   Body mass index is 36.67 kg/m.    Visual Acuity Screening   Right eye Left eye Both eyes  Without correction: 20/30 20/30   With correction:       Patient is in no distress; well developed, nourished and groomed; neck is supple  CARDIOVASCULAR:  Examination of carotid arteries is normal; no carotid  bruits  Regular rate and rhythm, no murmurs  Examination of peripheral vascular system by observation and palpation is normal  EYES:  Ophthalmoscopic exam of optic discs and posterior segments is normal; no hemorrhages  SCLERAL INJECTION (RIGHT GREATER THAN LEFT)  MUSCULOSKELETAL:  Gait, strength, tone, movements noted in Neurologic exam below  NEUROLOGIC: MENTAL STATUS:  No flowsheet data found.  awake, alert, oriented to person, place and time  recent and remote memory intact  normal attention and concentration  language fluent, comprehension intact, naming intact,   fund of knowledge appropriate  CRANIAL NERVE:   2nd - SLIGHTLY BLURRED OPTIC DISC MARGINS on  fundoscopic exam  2nd, 3rd, 4th, 6th - pupils equal and reactive to light, visual fields full to confrontation, extraocular muscles intact, no nystagmus; MILD RIGHT PTOSIS  5th - facial sensation symmetric  7th - facial strength symmetric  8th - hearing intact  9th - palate elevates symmetrically, uvula midline  11th - shoulder shrug symmetric  12th - tongue protrusion midline  MOTOR:   normal bulk and tone, full strength in the BUE, BLE  SENSORY:   normal and symmetric to light touch, pinprick, temperature, vibration  COORDINATION:   finger-nose-finger, fine finger movements normal  REFLEXES:   deep tendon reflexes present and symmetric  GAIT/STATION:   narrow based gait; able to walk on toes, heels and tandem; romberg is negative    DIAGNOSTIC DATA (LABS, IMAGING, TESTING) - I reviewed patient records, labs, notes, testing and imaging myself where available.  Lab Results  Component Value Date   WBC 5.6 11/04/2011   HGB 15.2 11/04/2011   HCT 44.1 11/04/2011   MCV 84.0 11/04/2011   PLT 129 (L) 11/04/2011      Component Value Date/Time   NA 140 11/04/2011 1300   K 3.3 (L) 11/04/2011 1300   CL 104 11/04/2011 1300   CO2 28 11/04/2011 1300   GLUCOSE 94 11/04/2011 1300   BUN 13 11/04/2011 1300   CREATININE 1.23 11/04/2011 1300   CALCIUM 9.3 11/04/2011 1300   GFRNONAA 75 (L) 11/04/2011 1300   GFRAA 87 (L) 11/04/2011 1300   No results found for: CHOL, HDL, LDLCALC, LDLDIRECT, TRIG, CHOLHDL No results found for: UYQI3KHGBA1C No results found for: VITAMINB12 No results found for: TSH  11/04/11 CT head [I reviewed images myself and agree with interpretation. -VRP]  - No acute intracranial abnormalities.  The appearance of [name] is normal.     ASSESSMENT AND PLAN  40 y.o. year old male here with weight gain, hypertension, blurred vision, papilledema with increasing problems of blurred vision and headaches. Headaches improved with topiramate, but blurred  vision continues.   Ddx: pseudotumor cerebri (IIH), CNS vascular/structural, migraine variant, sleep apnea  1. Papilledema   2. Migraine without aura and without status migrainosus, not intractable   3. BMI 37.0-37.9, adult   4. Excessive daytime sleepiness     PLAN: - follow up MRI Leonard (rule out secondary causes of headache) - follow up sleep study (excessive daytime sleepiness, snoring, hypertension, headaches, obesity; eval for OSA) - continue empiric topiramate (can treat for migraine, IIH and obesity); continue TPX 50mg  twice a day; if symptoms worsen or fail to get better, then will go up to 100mg  twice a day  Meds ordered this encounter  Medications  . topiramate (TOPAMAX) 50 MG tablet    Sig: Take 1 tablet (50 mg total) by mouth 2 (two) times daily.    Dispense:  60 tablet    Refill:  12   Return in about 3 months (around 11/25/2016).    Suanne Marker, MD 08/27/2016, 10:49 AM Certified in Neurology, Neurophysiology and Neuroimaging  [facility] [address] [place], [place] [address] [phone]

## 2016-08-27 NOTE — Patient Instructions (Addendum)
-   call [facility] at [phone] to schedule MRI brain  - follow up with Dr. Lucretia RoersWood in 2-3 months for eye exam checkup  - follow up sleep study

## 2016-09-02 ENCOUNTER — Ambulatory Visit: Payer: BLUE CROSS/BLUE SHIELD | Admitting: Diagnostic Neuroimaging

## 2016-09-29 ENCOUNTER — Ambulatory Visit (INDEPENDENT_AMBULATORY_CARE_PROVIDER_SITE_OTHER): Payer: BLUE CROSS/BLUE SHIELD | Admitting: Neurology

## 2016-09-29 DIAGNOSIS — G4733 Obstructive sleep apnea (adult) (pediatric): Secondary | ICD-10-CM | POA: Diagnosis not present

## 2016-09-29 DIAGNOSIS — R0683 Snoring: Secondary | ICD-10-CM

## 2016-09-29 DIAGNOSIS — H4711 Papilledema associated with increased intracranial pressure: Secondary | ICD-10-CM

## 2016-10-03 ENCOUNTER — Telehealth: Payer: Self-pay | Admitting: Neurology

## 2016-10-03 DIAGNOSIS — J301 Allergic rhinitis due to pollen: Secondary | ICD-10-CM

## 2016-10-03 MED ORDER — MOMETASONE FUROATE 50 MCG/ACT NA SUSP: 2.0000 | Freq: Every day | NASAL | 12 refills | Status: DC

## 2016-10-03 NOTE — Procedures (Signed)
[address], [address] [place], [place] [address] NAME: Leonard HiltsKirby Diaz DOB: 14-Jul-1976 MEDICAL RECORD [id number] DOS:  09/30/16 REFERRING PHYSICIAN: Vikram Penumalli,MD  STUDY PERFORMED:  HST/ Out of Center Sleep Test  HISTORY: Leonard HiltsRamzee Woodbeck is a 41 y.o. male patient of Dr. Dow AdolphMoirera's , who has been referred via his optometrist. It was during an eye exam that papilledema was noted. At the same time he had complained about headaches that have increased in frequency progressively over the last year. Treated with topiramate hoping that he would support weight loss, headache prophylaxis and treat intracranial hypertension. He also suffers from nasal congestion    A sleep study was ordered as the patient had reported excessive daytime sleepiness, witnessed snoring, a history of hypertension, headaches and obesity with a BMI exceeding 37- all these  making him a high risk patient for the presence of obstructive sleep apnea. He has a strong family history of OSA.  Epworth Sleepiness Score at  19 , Fatigue Severity score at 37 points BMI: 37 Medication : see consult , recommendation to use saline nasal spray or sinus rinse.  STUDY RESULTS:  Total Recording Time:  9h 50 m,  Total Apnea/Hypopnea Index (AHI) 27.3 Average Oxygen Saturation: SpO2  92%, Hypoxemia duration at or below 88% was 16 minutes Lowest Oxygen desaturation: SpO2 nadir at  81% EKG , heart rate variability between 50 and 150/ min, borderline tachy- brady- rhythm.    IMPRESSION: OSA is present, given an AHI of 27.3, at  moderate severity without prolonged hypoxemia.  There is evidence of tachycardia at night, which can be apnea related.  EDS ( excessive daytime sleepiness) is of such severity , that treatment needs to be immediate.  RECOMMENDATION: CPAP auto titration 5-15 cm water . heated humidity. Thispatient may need a FFM as he is habitually mouth breathing. Recommended Nasal spray use nightly before CPAP is placed.   I certify that I  have reviewed the raw data recording prior to the issuance of this report in accordance with the standards of Accreditation of the American Academy of Sleep medicine (AASM) Melvyn Novasarmen Darril Patriarca, MD   10-03-2016 Diplomat, American Board of Psychiatry and Neurology Diplomat, American Board of Sleep Medicine Medical Director of  [facility], an AASM accredited facility

## 2016-10-03 NOTE — Telephone Encounter (Signed)
OSA is present, given an AHI of 27.3, at  moderate severity without prolonged hypoxemia.  There is evidence of tachycardia at night, which can be apnea related.  EDS ( excessive daytime sleepiness) is of such severity , that treatment needs to be immediate.  RECOMMENDATION: CPAP auto titration 5-15 cm water . heated humidity. This patient may need a FFM as he is habitually mouth breathing. Recommended Nasal spray use nightly before CPAP is placed.

## 2016-10-06 NOTE — Telephone Encounter (Signed)
I called pt. I advised him that Dr. Vickey Hugerohmeier reviewed his sleep study and found that he does have osa at moderate severity with evidence of tachycardia. Dr. Vickey Hugerohmeier recommends treatment in the form on a cpap since pt is excessively sleepy during the daytime. I advised him that Dr. Vickey Hugerohmeier recommended a nasal spray use nightly before cpap is placed and that since he is a mouth breather, a FFM may be a good option. Pt is agreeable to a cpap. I advised him that [facility], a DME, will call pt within a week to get pt set up on cpap. I reviewed cpap compliance with pt. Pt is agreeable to a follow up, and therefore, a follow up was scheduled for 12/18/2016 at 8:30am. Pt verbalized understanding of results. Pt had no questions at this time but was encouraged to call back if questions arise. CPAP letter mailed to pt.

## 2016-10-06 NOTE — Telephone Encounter (Signed)
-----   Message from Melvyn Novasarmen Dohmeier, MD sent at 10/03/2016 10:42 AM EST ----- OSA is present, given an AHI of 27.3, at  moderate severity without prolonged hypoxemia.  There is evidence of tachycardia at night, which can be apnea related.  EDS ( excessive daytime sleepiness) is of such severity , that treatment needs to be immediate.  RECOMMENDATION: CPAP auto titration 5-15 cm water . heated humidity. This patient may need a FFM as he is habitually mouth breathing. Recommended Nasal spray use nightly before CPAP is placed.

## 2016-12-02 ENCOUNTER — Ambulatory Visit: Payer: BLUE CROSS/BLUE SHIELD | Admitting: Diagnostic Neuroimaging

## 2016-12-02 ENCOUNTER — Telehealth: Payer: Self-pay | Admitting: *Deleted

## 2016-12-02 NOTE — Telephone Encounter (Signed)
Spoke with patient after noticing his 3 month FU which had to be cancelled this morning due to snow, was rescheduled for July. Advised him this RN will move up sooner. Also discussed that he did not call and schedule his MRI which Dr Marjory LiesPenumalli ordered in Oct 2017. Patient stated he never got a call regarding MRI. This RN advised it has been authorized with his insurance, and [facility]. Gave him phone number. Rescheduled his FU to April date, post MRI; advised he'll get a reminder call before his FU. Patient verbalized understanding, agreement.

## 2016-12-11 ENCOUNTER — Ambulatory Visit
Admission: RE | Admit: 2016-12-11 | Discharge: 2016-12-11 | Disposition: A | Discharge status: Home / Self Care | Payer: BLUE CROSS/BLUE SHIELD | Source: Ambulatory Visit | Attending: Diagnostic Neuroimaging | Admitting: Diagnostic Neuroimaging

## 2016-12-11 DIAGNOSIS — R519 Headache, unspecified: Secondary | ICD-10-CM

## 2016-12-11 DIAGNOSIS — R51 Headache: Secondary | ICD-10-CM

## 2016-12-11 DIAGNOSIS — H4710 Unspecified papilledema: Secondary | ICD-10-CM

## 2016-12-11 MED ORDER — GADOBENATE DIMEGLUMINE 529 MG/ML IV SOLN
20.0000 mL | Freq: Once | INTRAVENOUS | Status: AC | PRN
  Administered 2016-12-11: 20 mL via INTRAVENOUS

## 2016-12-15 ENCOUNTER — Telehealth: Payer: Self-pay | Admitting: *Deleted

## 2016-12-15 NOTE — Telephone Encounter (Signed)
Per Dr Marjory LiesPenumalli, spoke with  Patient and informed him his MRI brain result is normal. Advised Dr Marjory LiesPenumalli will continue with current treatment plan. Patient requested his FU be moved until after he sees Dr Vickey Hugerohmeier re: CPAP. Moved follow up and advised he continue taking topamax 50 mg twice daily, call for any problems, questions, and advised he monitor his symptoms.  Patient verbalized understanding, agreement to plan.

## 2016-12-18 ENCOUNTER — Ambulatory Visit: Payer: Self-pay | Admitting: Neurology

## 2016-12-30 ENCOUNTER — Ambulatory Visit: Payer: Self-pay | Admitting: Diagnostic Neuroimaging

## 2017-02-19 ENCOUNTER — Ambulatory Visit: Payer: BLUE CROSS/BLUE SHIELD | Admitting: Neurology

## 2017-02-24 ENCOUNTER — Ambulatory Visit (INDEPENDENT_AMBULATORY_CARE_PROVIDER_SITE_OTHER): Payer: BLUE CROSS/BLUE SHIELD | Admitting: Diagnostic Neuroimaging

## 2017-02-24 ENCOUNTER — Encounter: Payer: Self-pay | Admitting: Diagnostic Neuroimaging

## 2017-02-24 ENCOUNTER — Telehealth: Payer: Self-pay

## 2017-02-24 VITALS — BP 112/71 | HR 78 | Ht 68.0 in | Wt 235.3 lb

## 2017-02-24 DIAGNOSIS — G4733 Obstructive sleep apnea (adult) (pediatric): Secondary | ICD-10-CM | POA: Diagnosis not present

## 2017-02-24 DIAGNOSIS — G43009 Migraine without aura, not intractable, without status migrainosus: Secondary | ICD-10-CM | POA: Diagnosis not present

## 2017-02-24 DIAGNOSIS — H4710 Unspecified papilledema: Secondary | ICD-10-CM | POA: Diagnosis not present

## 2017-02-24 DIAGNOSIS — Z6837 Body mass index (BMI) 37.0-37.9, adult: Secondary | ICD-10-CM

## 2017-02-24 MED ORDER — TOPIRAMATE 50 MG PO TABS: 50.0000 mg | ORAL_TABLET | Freq: Two times a day (BID) | ORAL | 4 refills | Status: DC

## 2017-02-24 NOTE — Progress Notes (Signed)
[facility]  PATIENT: Leonard Carter DOB: 1975/12/01  REFERRING CLINICIAN: Allene Pyo, OD HISTORY FROM: patient  REASON FOR VISIT: follow up   HISTORICAL  CHIEF COMPLAINT:  Chief Complaint  Patient presents with  . Follow-up    3 month    HISTORY OF PRESENT ILLNESS:   UPDATE 02/24/17: Since last visit, HA are slightly better. Tried CPAP (few weeks) but could not tolerate. Vision is better. No been back to Dr. Lucretia Roers yet.   UPDATE 08/27/16: Since last visit, tolerating TPX, and no more headaches. Appetite is reduced and he is noting some weight loss. Blurred vision is the same. MRI not done yet. Sleep study setup for 09/10/16.   PRIOR HPI (07/16/16): 41 year old right-handed male here for valuation of papilledema and blurred vision. For past 1 year patient has had gradual and progressive blurred vision. Symptoms are worse lately. He also is having increasing headaches in the middle of his head, throbbing sensation with photophobia. Headaches can last hours a time. Patient has tried ibuprofen without relief. Headaches were once per month and now once per week. 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 Patient also has had weight gain from 210 up to 245 pounds over the past 5 years.   REVIEW OF SYSTEMS: Full 14 system review of systems performed and negative with exception of: blurred vision freq urination headache snoring joint pain.    ALLERGIES: No Known Allergies  HOME MEDICATIONS: Outpatient Medications Prior to Visit  Medication Sig Dispense Refill  . topiramate (TOPAMAX) 50 MG tablet Take 1 tablet (50 mg total) by mouth 2 (two) times daily. 60 tablet 12  . mometasone (NASONEX) 50 MCG/ACT nasal spray Place 2 sprays into the nose daily. (Patient not taking: Reported on 02/24/2017) 17 g 12   No facility-administered medications prior to visit.     PAST MEDICAL HISTORY: Past Medical History:  Diagnosis  Date  . Asthma     PAST SURGICAL HISTORY: No past surgical history on file.  FAMILY HISTORY: Family History  Problem Relation Age of Onset  . Ulcers Father     SOCIAL HISTORY:  Social History   Social History  . Marital status: Married    Spouse name: N/A  . Number of children: 4  . Years of education: 15   Occupational History  .      [employer]   Social History Main Topics  . Smoking status: Current Every Day Smoker    Packs/day: 0.25    Types: Cigarettes  . Smokeless tobacco: Never Used     Comment: 08/27/16 smoking < 1/2 PPD, trying to quit  . Alcohol use Yes     Comment: drinks on weekends  . Drug use: Yes    Types: Marijuana     Comment: 07/16/16 quit 3-4 yrs ago, did smoke recently on vacation  . Sexual activity: Not on file   Other Topics Concern  . Not on file   Social History Narrative  . No narrative on file     PHYSICAL EXAM  GENERAL EXAM/CONSTITUTIONAL: Vitals:  Vitals:   02/24/17 0849  BP: 112/71  Pulse: 78  Weight: 235 lb 5 oz (106.7 kg)  Height: 5\' 8"  (1.727 m)   Wt Readings from Last 5 Encounters:  02/24/17 235 lb 5 oz (106.7 kg)  08/27/16 241 lb 3.2 oz (109.4 kg)  08/05/16 238 lb (108 kg)  07/16/16 249 lb (112.9 kg)  11/04/11 220 lb (99.8 kg)  Body mass index is 35.78 kg/m.   No exam data present  Patient is in no distress; well developed, nourished and groomed; neck is supple  CARDIOVASCULAR:  Examination of carotid arteries is normal; no carotid bruits  Regular rate and rhythm, no murmurs  Examination of peripheral vascular system by observation and palpation is normal  EYES:  Ophthalmoscopic exam of optic discs and posterior segments is normal; no hemorrhages  SCLERAL INJECTION (RIGHT)  MUSCULOSKELETAL:  Gait, strength, tone, movements noted in Neurologic exam below  NEUROLOGIC: MENTAL STATUS:  No flowsheet data found.  awake, alert, oriented to person, place and time  recent and remote memory  intact  normal attention and concentration  language fluent, comprehension intact, naming intact,   fund of knowledge appropriate  CRANIAL NERVE:   2nd - OPTIC DISC MARGINS SHARP on fundoscopic exam  2nd, 3rd, 4th, 6th - pupils equal and reactive to light, visual fields full to confrontation, extraocular muscles intact, no nystagmus; MILD RIGHT PTOSIS  5th - facial sensation symmetric  7th - facial strength symmetric  8th - hearing intact  9th - palate elevates symmetrically, uvula midline  11th - shoulder shrug symmetric  12th - tongue protrusion midline  MOTOR:   normal bulk and tone, full strength in the BUE, BLE  SENSORY:   normal and symmetric to light touch  COORDINATION:   finger-nose-finger, fine finger movements normal  REFLEXES:   deep tendon reflexes present and symmetric  GAIT/STATION:   narrow based gait    DIAGNOSTIC DATA (LABS, IMAGING, TESTING) - I reviewed patient records, labs, notes, testing and imaging myself where available.  Lab Results  Component Value Date   WBC 5.6 11/04/2011   HGB 15.2 11/04/2011   HCT 44.1 11/04/2011   MCV 84.0 11/04/2011   PLT 129 (L) 11/04/2011      Component Value Date/Time   NA 140 11/04/2011 1300   K 3.3 (L) 11/04/2011 1300   CL 104 11/04/2011 1300   CO2 28 11/04/2011 1300   GLUCOSE 94 11/04/2011 1300   BUN 13 11/04/2011 1300   CREATININE 1.23 11/04/2011 1300   CALCIUM 9.3 11/04/2011 1300   GFRNONAA 75 (L) 11/04/2011 1300   GFRAA 87 (L) 11/04/2011 1300   No results found for: CHOL, HDL, LDLCALC, LDLDIRECT, TRIG, CHOLHDL No results found for: ZOXW9U No results found for: VITAMINB12 No results found for: TSH  11/04/11 CT head [I reviewed images myself and agree with interpretation. -VRP]  - No acute intracranial abnormalities.  The appearance of [name] is normal.  09/29/16 Home PSG  - OSA is present, given an AHI of 27.3, at moderate severity without prolonged hypoxemia. There is evidence  of tachycardia at night, which can be apnea related. EDS ( excessive daytime sleepiness) is of such severity, that treatment needs to be immediate.  - CPAP auto titration 5-15 cm water, heated humidity. This patient may need a FFM as he is habitually mouth breathing.  - Recommended Nasal spray use nightly before CPAP is placed.   12/11/16 MRI Leonard [I reviewed images myself and agree with interpretation. -VRP]  - normal     ASSESSMENT AND PLAN  41 y.o. year old male here with weight gain, hypertension, blurred vision, papilledema with increasing problems of blurred vision and headaches. Headaches improved with topiramate, but blurred vision continues.   Dx: pseudotumor cerebri, migraine variant, sleep apnea  1. Migraine without aura and without status migrainosus, not intractable   2. Papilledema   3. BMI 37.0-37.9,  adult   4. OSA (obstructive sleep apnea)      PLAN:  I spent 25 minutes of face to face time with patient. Greater than 50% of time was spent in counseling and coordination of care with patient. In summary we discussed:   MIGRAINE VARIANT / IIH (established problem, improved) - continue empiric topiramate (can treat for migraine, IIH and obesity); continue TPX 50mg  twice a day; if symptoms worsen or fail to get better, then will go up to 100mg  twice a day - follow up with Dr. Lucretia RoersWood for eye exam testing  SLEEP APNEA (established problem, worsening) - follow up with sleep clinic to help with mask fitting issues  Meds ordered this encounter  Medications  . topiramate (TOPAMAX) 50 MG tablet    Sig: Take 1 tablet (50 mg total) by mouth 2 (two) times daily.    Dispense:  180 tablet    Refill:  4   Return in about 6 months (around 08/26/2017).    Suanne MarkerVIKRAM R. Fermin Yan, MD 02/24/2017, 9:46 AM Certified in Neurology, Neurophysiology and Neuroimaging  [facility] [address], [address] [place], [place] [address] [phone]

## 2017-02-24 NOTE — Patient Instructions (Signed)
  MIGRAINE VARIANT / IIH (established problem, improved) - continue topiramate 50mg  twice a day - follow up with Dr. Lucretia RoersWood for eye exam testing  SLEEP APNEA (established problem, worsening) - follow up with sleep clinic to help with mask fitting issues

## 2017-02-24 NOTE — Telephone Encounter (Signed)
Patient came by to sleep lab for [facility]

## 2017-02-24 NOTE — Telephone Encounter (Signed)
Faxed order to [facility]

## 2017-02-24 NOTE — Telephone Encounter (Signed)
Patient came by sleep lab for a mask fitting. He could not get used to wearing cpap machine and turned it back in to [facility]. He only tried one mask and that was the amara view. He is a side and prone sleeper. This mask tends to have high leak in these positions. I fitted him with the Res Med F20 large. Great fit with a 0 leak on cpap of 5. Also tried the F&P simplus large and this mask also had 2 leak on cpap of 5. Patient like the Res med mask better. I explained the importance of trying this again and giving it time to adjust. The new mask will help with leak and hopefully he can wear it. I will check with [facility] to get this back started and have them give patient a call. He is willing to try again. I will get Dr. Golden Hurterohemier to script this mask ad send it to [facility].

## 2017-03-24 ENCOUNTER — Ambulatory Visit: Payer: BLUE CROSS/BLUE SHIELD | Admitting: Diagnostic Neuroimaging

## 2017-06-11 ENCOUNTER — Other Ambulatory Visit: Payer: Self-pay | Admitting: Internal Medicine

## 2017-06-11 DIAGNOSIS — I10 Essential (primary) hypertension: Secondary | ICD-10-CM

## 2017-06-16 ENCOUNTER — Ambulatory Visit
Admission: RE | Admit: 2017-06-16 | Discharge: 2017-06-16 | Disposition: A | Discharge status: Home / Self Care | Payer: BLUE CROSS/BLUE SHIELD | Source: Ambulatory Visit | Attending: Internal Medicine | Admitting: Internal Medicine

## 2017-06-16 DIAGNOSIS — I10 Essential (primary) hypertension: Secondary | ICD-10-CM

## 2017-08-26 ENCOUNTER — Ambulatory Visit: Payer: BLUE CROSS/BLUE SHIELD | Admitting: Diagnostic Neuroimaging

## 2017-08-26 ENCOUNTER — Encounter: Payer: Self-pay | Admitting: Diagnostic Neuroimaging

## 2017-08-26 VITALS — BP 124/85 | HR 56 | Wt 231.8 lb

## 2017-08-26 DIAGNOSIS — G43009 Migraine without aura, not intractable, without status migrainosus: Secondary | ICD-10-CM

## 2017-08-26 DIAGNOSIS — H4711 Papilledema associated with increased intracranial pressure: Secondary | ICD-10-CM

## 2017-08-26 MED ORDER — TOPIRAMATE 50 MG PO TABS: 50.0000 mg | ORAL_TABLET | Freq: Two times a day (BID) | ORAL | 4 refills | Status: DC

## 2017-08-26 NOTE — Patient Instructions (Signed)
-   follow up with Dr. Lucretia RoersWood for eye exam testing  - follow up with sleep clinic to help with mask fitting issues

## 2017-08-26 NOTE — Progress Notes (Signed)
[facility]  PATIENT: Leonard Carter DOB: [birth year]  REFERRING CLINICIAN: Allene Pyo Wood, OD HISTORY FROM: patient  REASON FOR VISIT: follow up   HISTORICAL  CHIEF COMPLAINT:  Chief Complaint  Patient presents with  . Migraine    rm 7, "still have blurred vision off and on, can this be from high BP and potassium?; need to make FU with Dr Lucretia RoersWood; less migraines/headaches now; not using CPAP"  . Follow-up    6 month    HISTORY OF PRESENT ILLNESS:   UPDATE (08/26/17, VRP): Since last visit, doing well. Tolerating meds. No alleviating or aggravating factors. HA stable. Vision poor. Has not been back to Dr. Lucretia RoersWood (optometry) yet. Not able to tolerate CPAP, even after new mask fitting.   UPDATE 02/24/17: Since last visit, HA are slightly better. Tried CPAP (few weeks) but could not tolerate. Vision is better. No been back to Dr. Lucretia RoersWood yet.   UPDATE 08/27/16: Since last visit, tolerating TPX, and no more headaches. Appetite is reduced and he is noting some weight loss. Blurred vision is the same. MRI not done yet. Sleep study setup for 09/10/16.   PRIOR HPI (07/16/16): 41 year old right-handed male here for valuation of papilledema and blurred vision. For past 1 year patient has had gradual and progressive blurred vision. Symptoms are worse lately. He also is having increasing headaches in the middle of his head, throbbing sensation with photophobia. Headaches can last hours a time. Patient has tried ibuprofen without relief. Headaches were once per month and now once per week. 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 Patient also has had weight gain from 210 up to 245 pounds over the past 5 years.   REVIEW OF SYSTEMS: Full 14 system review of systems performed and negative with exception of: freq urination blurred vision.    ALLERGIES: No Known Allergies  HOME MEDICATIONS: Outpatient Medications Prior to Visit    Medication Sig Dispense Refill  . amLODipine (NORVASC) 5 MG tablet 5 mg daily.  2  . mometasone (NASONEX) 50 MCG/ACT nasal spray Place 2 sprays into the nose daily. 17 g 12  . potassium chloride (K-DUR) 10 MEQ tablet 10 mEq daily.  3  . topiramate (TOPAMAX) 50 MG tablet Take 1 tablet (50 mg total) by mouth 2 (two) times daily. 180 tablet 4  . chlorthalidone (HYGROTON) 25 MG tablet Take 25 mg by mouth daily.     No facility-administered medications prior to visit.     PAST MEDICAL HISTORY: Past Medical History:  Diagnosis Date  . Asthma   . Headache     PAST SURGICAL HISTORY: No past surgical history on file.  FAMILY HISTORY: Family History  Problem Relation Age of Onset  . Ulcers Father     SOCIAL HISTORY:  Social History   Socioeconomic History  . Marital status: Married    Spouse name: Not on file  . Number of children: 4  . Years of education: 5315  . Highest education level: Not on file  Social Needs  . Financial resource strain: Not on file  . Food insecurity - worry: Not on file  . Food insecurity - inability: Not on file  . Transportation needs - medical: Not on file  . Transportation needs - non-medical: Not on file  Occupational History    Comment: [employer]  Tobacco Use  . Smoking status: Current Every Day Smoker    Packs/day: 0.25    Types: Cigarettes  . Smokeless  tobacco: Never Used  . Tobacco comment: 08/27/16 smoking < 1/2 PPD, trying to quit  Substance and Sexual Activity  . Alcohol use: Yes    Comment: drinks on weekends  . Drug use: Yes    Types: Marijuana    Comment: 07/16/16 quit 3-4 yrs ago, did smoke recently on vacation  . Sexual activity: Not on file  Other Topics Concern  . Not on file  Social History Narrative  . Not on file     PHYSICAL EXAM  GENERAL EXAM/CONSTITUTIONAL: Vitals:  Vitals:   08/26/17 0927  BP: 124/85  Pulse: (!) 56  Weight: 231 lb 12.8 oz (105.1 kg)   Wt Readings from Last 5 Encounters:  08/26/17 231 lb  12.8 oz (105.1 kg)  02/24/17 235 lb 5 oz (106.7 kg)  08/27/16 241 lb 3.2 oz (109.4 kg)  08/05/16 238 lb (108 kg)  07/16/16 249 lb (112.9 kg)   Body mass index is 35.25 kg/m.   No exam data present  Patient is in no distress; well developed, nourished and groomed; neck is supple  CARDIOVASCULAR:  Examination of carotid arteries is normal; no carotid bruits  Regular rate and rhythm, no murmurs  Examination of peripheral vascular system by observation and palpation is normal  EYES:  Ophthalmoscopic exam of optic discs and posterior segments is normal; no hemorrhages   MUSCULOSKELETAL:  Gait, strength, tone, movements noted in Neurologic exam below  NEUROLOGIC: MENTAL STATUS:  No flowsheet data found.  awake, alert, oriented to person, place and time  recent and remote memory intact  normal attention and concentration  language fluent, comprehension intact, naming intact,   fund of knowledge appropriate  CRANIAL NERVE:   2nd - OPTIC DISC MARGINS SHARP on fundoscopic exam  2nd, 3rd, 4th, 6th - pupils equal and reactive to light, visual fields full to confrontation, extraocular muscles intact, no nystagmus  5th - facial sensation symmetric  7th - facial strength symmetric  8th - hearing intact  9th - palate elevates symmetrically, uvula midline  11th - shoulder shrug symmetric  12th - tongue protrusion midline  MOTOR:   normal bulk and tone, full strength in the BUE, BLE  SENSORY:   normal and symmetric to light touch  COORDINATION:   finger-nose-finger, fine finger movements normal  REFLEXES:   deep tendon reflexes present and symmetric  GAIT/STATION:   narrow based gait    DIAGNOSTIC DATA (LABS, IMAGING, TESTING) - I reviewed patient records, labs, notes, testing and imaging myself where available.  Lab Results  Component Value Date   WBC 5.6 11/04/2011   HGB 15.2 11/04/2011   HCT 44.1 11/04/2011   MCV 84.0 11/04/2011   PLT 129  (L) 11/04/2011      Component Value Date/Time   NA 140 11/04/2011 1300   K 3.3 (L) 11/04/2011 1300   CL 104 11/04/2011 1300   CO2 28 11/04/2011 1300   GLUCOSE 94 11/04/2011 1300   BUN 13 11/04/2011 1300   CREATININE 1.23 11/04/2011 1300   CALCIUM 9.3 11/04/2011 1300   GFRNONAA 75 (L) 11/04/2011 1300   GFRAA 87 (L) 11/04/2011 1300   No results found for: CHOL, HDL, LDLCALC, LDLDIRECT, TRIG, CHOLHDL No results found for: AOZH0QHGBA1C No results found for: VITAMINB12 No results found for: TSH  11/04/11 CT head [I reviewed images myself and agree with interpretation. -VRP]  - No acute intracranial abnormalities.  The appearance of [name] is normal.  09/29/16 Home PSG  - OSA is present, given an  AHI of 27.3, at moderate severity without prolonged hypoxemia. There is evidence of tachycardia at night, which can be apnea related. EDS ( excessive daytime sleepiness) is of such severity, that treatment needs to be immediate.  - CPAP auto titration 5-15 cm water, heated humidity. This patient may need a FFM as he is habitually mouth breathing.  - Recommended Nasal spray use nightly before CPAP is placed.   12/11/16 MRI Leonard [I reviewed images myself and agree with interpretation. -VRP]  - normal     ASSESSMENT AND PLAN  41 y.o. year old male here with weight gain, hypertension, blurred vision, papilledema with increasing problems of blurred vision and headaches. Headaches improved with topiramate, but blurred vision continues.   Dx: pseudotumor cerebri, migraine variant, sleep apnea  No diagnosis found.   PLAN:  I spent 15 minutes of face to face time with patient. Greater than 50% of time was spent in counseling and coordination of care with patient. In summary we discussed:   MIGRAINE VARIANT / IIH (established problem, stable) - continue empiric topiramate (can treat for migraine, IIH and obesity); continue TPX 50mg  twice a day; if symptoms worsen or fail to get better, then will  go up to 100mg  twice a day - follow up with Dr. Lucretia Roers for eye exam testing  SLEEP APNEA (established problem, worsening) - follow up with sleep clinic to help with mask fitting issues  Meds ordered this encounter  Medications  . topiramate (TOPAMAX) 50 MG tablet    Sig: Take 1 tablet (50 mg total) by mouth 2 (two) times daily.    Dispense:  180 tablet    Refill:  4   Return in about 6 months (around 02/24/2018).    Suanne Marker, MD 08/26/2017, 10:09 AM Certified in Neurology, Neurophysiology and Neuroimaging  [facility] [address] [place], [place] [address] [phone]

## 2018-03-02 ENCOUNTER — Ambulatory Visit: Payer: BLUE CROSS/BLUE SHIELD | Admitting: Diagnostic Neuroimaging

## 2018-11-16 ENCOUNTER — Other Ambulatory Visit: Payer: Self-pay | Admitting: Diagnostic Neuroimaging

## 2019-04-18 ENCOUNTER — Emergency Department (HOSPITAL_BASED_OUTPATIENT_CLINIC_OR_DEPARTMENT_OTHER): Payer: BC Managed Care – PPO

## 2019-04-18 ENCOUNTER — Encounter (HOSPITAL_BASED_OUTPATIENT_CLINIC_OR_DEPARTMENT_OTHER): Payer: Self-pay | Admitting: *Deleted

## 2019-04-18 ENCOUNTER — Other Ambulatory Visit: Payer: Self-pay

## 2019-04-18 ENCOUNTER — Emergency Department (HOSPITAL_BASED_OUTPATIENT_CLINIC_OR_DEPARTMENT_OTHER)
Admission: EM | Admit: 2019-04-18 | Discharge: 2019-04-18 | Disposition: A | Discharge status: Home / Self Care | Payer: BC Managed Care – PPO | Attending: Emergency Medicine | Admitting: Emergency Medicine

## 2019-04-18 DIAGNOSIS — K5732 Diverticulitis of large intestine without perforation or abscess without bleeding: Secondary | ICD-10-CM | POA: Insufficient documentation

## 2019-04-18 DIAGNOSIS — K529 Noninfective gastroenteritis and colitis, unspecified: Secondary | ICD-10-CM | POA: Diagnosis not present

## 2019-04-18 DIAGNOSIS — R109 Unspecified abdominal pain: Secondary | ICD-10-CM | POA: Diagnosis present

## 2019-04-18 DIAGNOSIS — F17210 Nicotine dependence, cigarettes, uncomplicated: Secondary | ICD-10-CM | POA: Insufficient documentation

## 2019-04-18 DIAGNOSIS — Z79899 Other long term (current) drug therapy: Secondary | ICD-10-CM | POA: Insufficient documentation

## 2019-04-18 DIAGNOSIS — J45909 Unspecified asthma, uncomplicated: Secondary | ICD-10-CM | POA: Insufficient documentation

## 2019-04-18 LAB — COMPREHENSIVE METABOLIC PANEL
ALT: 20 U/L (ref 0–44)
AST: 14 U/L — ABNORMAL LOW (ref 15–41)
Albumin: 3.9 g/dL (ref 3.5–5.0)
Alkaline Phosphatase: 66 U/L (ref 38–126)
Anion gap: 8 (ref 5–15)
BUN: 11 mg/dL (ref 6–20)
CO2: 24 mmol/L (ref 22–32)
Calcium: 8.8 mg/dL — ABNORMAL LOW (ref 8.9–10.3)
Chloride: 106 mmol/L (ref 98–111)
Creatinine, Ser: 1.5 mg/dL — ABNORMAL HIGH (ref 0.61–1.24)
GFR calc Af Amer: >60 mL/min (ref >60)
GFR calc non Af Amer: 57 mL/min — ABNORMAL LOW (ref >60)
Glucose, Bld: 100 mg/dL — ABNORMAL HIGH (ref 70–99)
Potassium: 3.3 mmol/L — ABNORMAL LOW (ref 3.5–5.1)
Sodium: 138 mmol/L (ref 135–145)
Total Bilirubin: 0.6 mg/dL (ref 0.3–1.2)
Total Protein: 7.1 g/dL (ref 6.5–8.1)

## 2019-04-18 LAB — CBC WITH DIFFERENTIAL/PLATELET
Abs Immature Granulocytes: 0.02 10*3/uL (ref 0.00–0.07)
Basophils Absolute: 0 10*3/uL (ref 0.0–0.1)
Basophils Relative: 0 %
Eosinophils Absolute: 0.2 10*3/uL (ref 0.0–0.5)
Eosinophils Relative: 2 %
HCT: 41.6 % (ref 39.0–52.0)
Hemoglobin: 13.4 g/dL (ref 13.0–17.0)
Immature Granulocytes: 0 %
Lymphocytes Relative: 15 %
Lymphs Abs: 1.3 10*3/uL (ref 0.7–4.0)
MCH: 27.9 pg (ref 26.0–34.0)
MCHC: 32.2 g/dL (ref 30.0–36.0)
MCV: 86.5 fL (ref 80.0–100.0)
Monocytes Absolute: 0.8 10*3/uL (ref 0.1–1.0)
Monocytes Relative: 9 %
Neutro Abs: 6.2 10*3/uL (ref 1.7–7.7)
Neutrophils Relative %: 74 %
Platelets: 208 10*3/uL (ref 150–400)
RBC: 4.81 MIL/uL (ref 4.22–5.81)
RDW: 13.7 % (ref 11.5–15.5)
WBC: 8.4 10*3/uL (ref 4.0–10.5)
nRBC: 0 % (ref 0.0–0.2)

## 2019-04-18 LAB — URINALYSIS, MICROSCOPIC (REFLEX): WBC, UA: NONE SEEN WBC/hpf (ref 0–5)

## 2019-04-18 LAB — URINALYSIS, ROUTINE W REFLEX MICROSCOPIC
Bilirubin Urine: NEGATIVE
Glucose, UA: NEGATIVE mg/dL
Ketones, ur: 15 mg/dL — AB
Leukocytes,Ua: NEGATIVE
Nitrite: NEGATIVE
Protein, ur: NEGATIVE mg/dL
Specific Gravity, Urine: >1.03 — ABNORMAL HIGH (ref 1.005–1.030)
pH: 6 (ref 5.0–8.0)

## 2019-04-18 LAB — LIPASE, BLOOD: Lipase: 24 U/L (ref 11–51)

## 2019-04-18 MED ORDER — MORPHINE SULFATE (PF) 4 MG/ML IV SOLN
INTRAVENOUS | Status: AC
  Filled 2019-04-18: qty 1

## 2019-04-18 MED ORDER — HYDROCODONE-ACETAMINOPHEN 5-325 MG PO TABS: 1.0000 | ORAL_TABLET | Freq: Four times a day (QID) | ORAL | 0 refills | Status: DC | PRN

## 2019-04-18 MED ORDER — ONDANSETRON HCL 4 MG/2ML IJ SOLN
INTRAMUSCULAR | Status: AC
  Filled 2019-04-18: qty 2

## 2019-04-18 MED ORDER — ONDANSETRON 4 MG PO TBDP: 4.0000 mg | ORAL_TABLET | Freq: Three times a day (TID) | ORAL | 1 refills | Status: DC | PRN

## 2019-04-18 MED ORDER — CIPROFLOXACIN HCL 500 MG PO TABS: 500.0000 mg | ORAL_TABLET | Freq: Two times a day (BID) | ORAL | 0 refills | Status: DC

## 2019-04-18 MED ORDER — CIPROFLOXACIN IN D5W 400 MG/200ML IV SOLN
400.0000 mg | Freq: Once | INTRAVENOUS | Status: AC
  Administered 2019-04-18: 400 mg via INTRAVENOUS
  Filled 2019-04-18: qty 200

## 2019-04-18 MED ORDER — IOHEXOL 300 MG/ML  SOLN
100.0000 mL | Freq: Once | INTRAMUSCULAR | Status: AC | PRN
  Administered 2019-04-18: 100 mL via INTRAVENOUS

## 2019-04-18 MED ORDER — METRONIDAZOLE 500 MG PO TABS: 500.0000 mg | ORAL_TABLET | Freq: Three times a day (TID) | ORAL | 0 refills | Status: DC

## 2019-04-18 MED ORDER — MORPHINE SULFATE (PF) 4 MG/ML IV SOLN
4.0000 mg | Freq: Once | INTRAVENOUS | Status: AC
  Administered 2019-04-18: 4 mg via INTRAVENOUS

## 2019-04-18 MED ORDER — SODIUM CHLORIDE 0.9 % IV SOLN
INTRAVENOUS | Status: DC
  Administered 2019-04-18: 19:00:00 via INTRAVENOUS

## 2019-04-18 MED ORDER — ONDANSETRON HCL 4 MG/2ML IJ SOLN
4.0000 mg | Freq: Once | INTRAMUSCULAR | Status: AC
  Administered 2019-04-18: 4 mg via INTRAVENOUS

## 2019-04-18 MED ORDER — METRONIDAZOLE IN NACL 5-0.79 MG/ML-% IV SOLN
500.0000 mg | Freq: Once | INTRAVENOUS | Status: AC
  Administered 2019-04-18: 500 mg via INTRAVENOUS
  Filled 2019-04-18: qty 100

## 2019-04-18 MED ORDER — SODIUM CHLORIDE 0.9 % IV BOLUS
1000.0000 mL | Freq: Once | INTRAVENOUS | Status: AC
  Administered 2019-04-18: 1000 mL via INTRAVENOUS

## 2019-04-18 NOTE — Discharge Instructions (Signed)
Take the antibiotics as directed.  Take the pain medicine as needed.  Very important that you call gastroenterology for follow-up.  Will probably need a colonoscopy as well.  Referral information for [facility] gastroenterology provided above.  Return for any new or worse symptoms.  Would expect some improvement on the antibiotics over the next couple days.

## 2019-04-18 NOTE — ED Provider Notes (Signed)
[facility] EMERGENCY DEPARTMENT Provider Note   CSN: [id number] Arrival date & time: 04/18/19  1709     History   Chief Complaint Chief Complaint  Patient presents with   Abdominal Pain    HPI Leonard Carter is a 43 y.o. male.     Patient with a complaint abdominal pain and diarrhea.  Some symptoms for a month but things got significantly worse over the past week.  Most of the pain occurred in the last week.  Patient's had some diarrhea prior to that.  Patient is also been on long-term antibiotics due to tooth infections on and off.  Patient denies any blood in the bowel movements.  Nausea or vomiting.  Patient's abdominal pain is kind of all over.  No upper respiratory symptoms.     Past Medical History:  Diagnosis Date   Asthma    Headache     Patient Active Problem List   Diagnosis Date Noted   Morbid obesity (HCC) 08/05/2016   Nocturia 08/05/2016   Papilledema associated with increased intracranial pressure 08/05/2016   OSA (obstructive sleep apnea) 08/05/2016   Snoring 08/05/2016    History reviewed. No pertinent surgical history.      Home Medications    Prior to Admission medications   Medication Sig Start Date End Date Taking? Authorizing Provider  amLODipine (NORVASC) 5 MG tablet 5 mg daily. 08/25/17  Yes [provider]  potassium chloride (K-DUR) 10 MEQ tablet 10 mEq daily. 08/19/17  Yes [provider]  topiramate (TOPAMAX) 50 MG tablet Take 1 tablet (50 mg total) by mouth 2 (two) times daily. 08/26/17  Yes Penumalli, Glenford BayleyVikram R, MD  ciprofloxacin (CIPRO) 500 MG tablet Take 1 tablet (500 mg total) by mouth 2 (two) times daily. 04/18/19   Vanetta MuldersZackowski, Ansh Fauble, MD  HYDROcodone-acetaminophen (NORCO/VICODIN) 5-325 MG tablet Take 1-2 tablets by mouth every 6 (six) hours as needed for moderate pain. 04/18/19   Vanetta MuldersZackowski, Shakoya Gilmore, MD  metroNIDAZOLE (FLAGYL) 500 MG tablet Take 1 tablet (500 mg total) by mouth 3 (three) times daily.  04/18/19   Vanetta MuldersZackowski, Cayleigh Paull, MD  mometasone (NASONEX) 50 MCG/ACT nasal spray Place 2 sprays into the nose daily. 10/03/16   Dohmeier, Porfirio Mylararmen, MD  ondansetron (ZOFRAN ODT) 4 MG disintegrating tablet Take 1 tablet (4 mg total) by mouth every 8 (eight) hours as needed. 04/18/19   Vanetta MuldersZackowski, Arista Kettlewell, MD    Family History Family History  Problem Relation Age of Onset   Ulcers Father     Social History Social History   Tobacco Use   Smoking status: Current Every Day Smoker    Packs/day: 0.25    Types: Cigarettes   Smokeless tobacco: Never Used   Tobacco comment: 08/27/16 smoking < 1/2 PPD, trying to quit  Substance Use Topics   Alcohol use: Yes    Comment: drinks on weekends   Drug use: Yes    Types: Marijuana    Comment: 07/16/16 quit 3-4 yrs ago, did smoke recently on vacation     Allergies   Patient has no known allergies.   Review of Systems Review of Systems  Constitutional: Negative for chills and fever.  HENT: Negative for congestion, rhinorrhea and sore throat.   Eyes: Negative for visual disturbance.  Respiratory: Negative for cough and shortness of breath.   Cardiovascular: Negative for chest pain and leg swelling.  Gastrointestinal: Positive for abdominal pain and diarrhea. Negative for nausea and vomiting.  Genitourinary: Negative for dysuria.  Musculoskeletal: Negative for back pain and  neck pain.  Skin: Negative for rash.  Neurological: Negative for dizziness, light-headedness and headaches.  Hematological: Does not bruise/bleed easily.  Psychiatric/Behavioral: Negative for confusion.     Physical Exam Updated Vital Signs BP 133/74    Pulse 65    Temp 99.7 F (37.6 C) (Oral)    Resp 18    Ht 1.727 m (5\' 8" )    Wt 103 kg    SpO2 100%    BMI 34.52 kg/m   Physical Exam Vitals signs and nursing note reviewed.  Constitutional:      General: He is not in acute distress.    Appearance: Normal appearance. He is well-developed.  HENT:     Head:  Normocephalic and atraumatic.  Eyes:     Extraocular Movements: Extraocular movements intact.     Conjunctiva/sclera: Conjunctivae normal.     Pupils: Pupils are equal, round, and reactive to light.  Neck:     Musculoskeletal: Normal range of motion and neck supple.  Cardiovascular:     Rate and Rhythm: Normal rate and regular rhythm.     Heart sounds: No murmur.  Pulmonary:     Effort: Pulmonary effort is normal. No respiratory distress.     Breath sounds: Normal breath sounds.  Abdominal:     Palpations: Abdomen is soft.     Tenderness: There is abdominal tenderness.     Comments: Mild abdominal tenderness mostly left side of abdomen.  No guarding.  Musculoskeletal: Normal range of motion.        General: No swelling.  Skin:    General: Skin is warm and dry.  Neurological:     General: No focal deficit present.     Mental Status: He is alert and oriented to person, place, and time.      ED Treatments / Results  Labs (all labs ordered are listed, but only abnormal results are displayed) Labs Reviewed  URINALYSIS, ROUTINE W REFLEX MICROSCOPIC - Abnormal; Notable for the following components:      Result Value   Specific Gravity, Urine >1.030 (*)    Hgb urine dipstick TRACE (*)    Ketones, ur 15 (*)    All other components within normal limits  COMPREHENSIVE METABOLIC PANEL - Abnormal; Notable for the following components:   Potassium 3.3 (*)    Glucose, Bld 100 (*)    Creatinine, Ser 1.50 (*)    Calcium 8.8 (*)    AST 14 (*)    GFR calc non Af Amer 57 (*)    All other components within normal limits  URINALYSIS, MICROSCOPIC (REFLEX) - Abnormal; Notable for the following components:   Bacteria, UA RARE (*)    All other components within normal limits  LIPASE, BLOOD  CBC WITH DIFFERENTIAL/PLATELET    EKG None  Radiology Ct Abdomen Pelvis W Contrast  Result Date: 04/18/2019 CLINICAL DATA:  Abdominal pain, gastro enteritis or colitis suspected. Abdominal pain  for a month, worse in the past week. EXAM: CT ABDOMEN AND PELVIS WITH CONTRAST TECHNIQUE: Multidetector CT imaging of the abdomen and pelvis was performed using the standard protocol following bolus administration of intravenous contrast. CONTRAST:  100mL OMNIPAQUE IOHEXOL 300 MG/ML  SOLN COMPARISON:  None. FINDINGS: Lower chest: No acute abnormality. Hepatobiliary: No focal liver abnormality is seen. No gallstones, gallbladder wall thickening, or biliary dilatation. Pancreas: Unremarkable. No pancreatic ductal dilatation or surrounding inflammatory changes. Spleen: Normal in size without focal abnormality. Adrenals/Urinary Tract: Adrenal glands appear normal. Kidneys are unremarkable without mass, stone  or hydronephrosis. No ureteral or bladder calculi identified. Bladder appears normal, partially decompressed. Stomach/Bowel: Thickening of the walls of the majority of the colon, but most prominent thickening is seen within the sigmoid colon and lower descending colon with surrounding pericolonic inflammation/fluid stranding. Scattered diverticulosis within the sigmoid colon. No dilated large or small bowel loops. Stomach is unremarkable, partially decompressed. Vascular/Lymphatic: Abdominal aorta is normal in caliber. No acute appearing vascular abnormality. No enlarged lymph nodes appreciated within the abdomen or pelvis. Reproductive: Prostate is unremarkable. Other: No abscess collection seen. No free intraperitoneal air. Musculoskeletal: No acute or suspicious osseous finding. IMPRESSION: 1. Thickening of the walls of the majority of the colon, but most prominent bowel wall thickening within the sigmoid colon and lower descending colon with surrounding pericolonic inflammation/fluid stranding. Findings are consistent with either a sigmoid colon diverticulitis with reactive thickening/inflammation of the more proximal colon or alternatively a diffuse colitis of infectious or inflammatory nature with incidental  underlying diverticulosis of the sigmoid colon. I favor acute diverticulitis, possibly a combination of diverticulitis and colitis. No associated bowel obstruction. No abscess collection seen. No free intraperitoneal air. 2. Remainder of the abdomen and pelvis CT is unremarkable, as detailed above. Electronically Signed   By: Bary RichardStan  Maynard M.D.   On: 04/18/2019 20:08    Procedures Procedures (including critical care time)  Medications Ordered in ED Medications  0.9 %  sodium chloride infusion ( Intravenous New Bag/Given 04/18/19 1910)  iohexol (OMNIPAQUE) 300 MG/ML solution 100 mL (100 mLs Intravenous Contrast Given 04/18/19 2001)  ciprofloxacin (CIPRO) IVPB 400 mg (0 mg Intravenous Stopped 04/18/19 2154)  metroNIDAZOLE (FLAGYL) IVPB 500 mg (500 mg Intravenous New Bag/Given 04/18/19 2205)  sodium chloride 0.9 % bolus 1,000 mL (0 mLs Intravenous Stopped 04/18/19 2146)  morphine 4 MG/ML injection 4 mg (4 mg Intravenous Given 04/18/19 2201)  ondansetron (ZOFRAN) injection 4 mg (4 mg Intravenous Given 04/18/19 2159)     Initial Impression / Assessment and Plan / ED Course  I have reviewed the triage vital signs and the nursing notes.  Pertinent labs & imaging results that were available during my care of the patient were reviewed by me and considered in my medical decision making (see chart for details).       No leukocytosis.  However CT scan suggestive of colitis and probably underlying diverticulitis according to the radiologist.  May explain the increased pain just this week.  Also could represent an inflammatory bowel either due to an infectious process or a autoimmune process.  However needs to be treated for diverticulitis.  Will treat with Cipro and Flagyl will give first dose IV here to jump start him.  And then continue him on oral antibiotics and have him follow-up with gastroenterology for colonoscopy.  Patient also will be treated with hydrocodone for pain.  No complicating factors on  the CT scan.  Patient nontoxic no acute distress.   Final Clinical Impressions(s) / ED Diagnoses   Final diagnoses:  Diverticulitis of large intestine without perforation or abscess without bleeding  Colitis    ED Discharge Orders         Ordered    ciprofloxacin (CIPRO) 500 MG tablet  2 times daily     04/18/19 2305    metroNIDAZOLE (FLAGYL) 500 MG tablet  3 times daily     04/18/19 2305    ondansetron (ZOFRAN ODT) 4 MG disintegrating tablet  Every 8 hours PRN     04/18/19 2305    HYDROcodone-acetaminophen (NORCO/VICODIN) 5-325  MG tablet  Every 6 hours PRN     04/18/19 2305           Fredia Sorrow, MD 04/18/19 2311

## 2019-04-18 NOTE — ED Notes (Signed)
Bedside toilet placed in room 

## 2019-04-18 NOTE — ED Triage Notes (Signed)
Abdominal pain and diarrhea for a month but worse over the past week.

## 2019-04-18 NOTE — ED Notes (Signed)
ED Provider at bedside. 

## 2019-10-06 ENCOUNTER — Ambulatory Visit: Payer: 59 | Attending: Internal Medicine

## 2019-10-06 DIAGNOSIS — Z20822 Contact with and (suspected) exposure to covid-19: Secondary | ICD-10-CM

## 2019-10-07 LAB — NOVEL CORONAVIRUS, NAA: SARS-CoV-2, NAA: NOT DETECTED

## 2019-10-17 ENCOUNTER — Ambulatory Visit: Payer: 59 | Attending: Internal Medicine

## 2019-10-17 DIAGNOSIS — Z20822 Contact with and (suspected) exposure to covid-19: Secondary | ICD-10-CM

## 2019-10-18 LAB — NOVEL CORONAVIRUS, NAA: SARS-CoV-2, NAA: NOT DETECTED

## 2020-10-06 ENCOUNTER — Other Ambulatory Visit: Payer: 59

## 2022-07-02 ENCOUNTER — Ambulatory Visit: Payer: BC Managed Care – PPO | Admitting: Internal Medicine

## 2022-07-02 ENCOUNTER — Encounter: Payer: Self-pay | Admitting: Internal Medicine

## 2022-07-02 VITALS — BP 142/98 | HR 94 | Temp 98.7°F | Ht 67.8 in | Wt 230.2 lb

## 2022-07-02 DIAGNOSIS — G8929 Other chronic pain: Secondary | ICD-10-CM

## 2022-07-02 DIAGNOSIS — I10 Essential (primary) hypertension: Secondary | ICD-10-CM

## 2022-07-02 DIAGNOSIS — M25561 Pain in right knee: Secondary | ICD-10-CM

## 2022-07-02 DIAGNOSIS — Z1159 Encounter for screening for other viral diseases: Secondary | ICD-10-CM

## 2022-07-02 DIAGNOSIS — R7309 Other abnormal glucose: Secondary | ICD-10-CM

## 2022-07-02 DIAGNOSIS — M25562 Pain in left knee: Secondary | ICD-10-CM

## 2022-07-02 DIAGNOSIS — Z6835 Body mass index (BMI) 35.0-35.9, adult: Secondary | ICD-10-CM

## 2022-07-02 DIAGNOSIS — F17200 Nicotine dependence, unspecified, uncomplicated: Secondary | ICD-10-CM

## 2022-07-02 DIAGNOSIS — Z1211 Encounter for screening for malignant neoplasm of colon: Secondary | ICD-10-CM

## 2022-07-02 DIAGNOSIS — Z7689 Persons encountering health services in other specified circumstances: Secondary | ICD-10-CM

## 2022-07-02 DIAGNOSIS — Z2821 Immunization not carried out because of patient refusal: Secondary | ICD-10-CM

## 2022-07-02 LAB — HEMOGLOBIN A1C: Hemoglobin A1C: 6

## 2022-07-02 LAB — TSH: TSH: 0.83 (ref 0.41–5.90)

## 2022-07-02 LAB — HEPATIC FUNCTION PANEL
ALT: 10 U/L (ref 10–40)
AST: 14 (ref 14–40)
Alkaline Phosphatase: 72 (ref 25–125)
Bilirubin, Total: 0.4

## 2022-07-02 LAB — BASIC METABOLIC PANEL
BUN: 12 (ref 4–21)
CO2: 25 — AB (ref 13–22)
Chloride: 108 (ref 99–108)
Glucose: 96
Potassium: 3.7 mEq/L (ref 3.5–5.1)
Sodium: 143 (ref 137–147)

## 2022-07-02 LAB — POCT URINALYSIS DIPSTICK
Bilirubin, UA: NEGATIVE
Blood, UA: NEGATIVE
Glucose, UA: NEGATIVE
Ketones, UA: NEGATIVE
Leukocytes, UA: NEGATIVE
Nitrite, UA: NEGATIVE
Protein, UA: NEGATIVE
Spec Grav, UA: 1.02 (ref 1.010–1.025)
Urobilinogen, UA: 1 E.U./dL
pH, UA: 7 (ref 5.0–8.0)

## 2022-07-02 LAB — CBC AND DIFFERENTIAL
HCT: 45 (ref 41–53)
Hemoglobin: 14.9 (ref 13.5–17.5)
Platelets: 188 10*3/uL (ref 150–400)
WBC: 3.9

## 2022-07-02 LAB — COMPREHENSIVE METABOLIC PANEL
Albumin: 4.2 (ref 3.5–5.0)
Calcium: 9.4 (ref 8.7–10.7)
Globulin: 2.7

## 2022-07-02 LAB — LIPID PANEL
Cholesterol: 173 (ref 0–200)
HDL: 48 (ref 35–70)
LDL Cholesterol: 102
Triglycerides: 129 (ref 40–160)

## 2022-07-02 LAB — CBC: RBC: 5.13 — AB (ref 3.87–5.11)

## 2022-07-02 LAB — HM HEPATITIS C SCREENING LAB: HM Hepatitis Screen: NEGATIVE

## 2022-07-02 MED ORDER — AMLODIPINE BESYLATE 5 MG PO TABS: 5.0000 mg | ORAL_TABLET | Freq: Every day | ORAL | 2 refills | Status: DC

## 2022-07-02 NOTE — Progress Notes (Signed)
Rich Brave Llittleton,acting as a Education administrator for Maximino Greenland, MD.,have documented all relevant documentation on the behalf of Maximino Greenland, MD,as directed by  Maximino Greenland, MD while in the presence of Maximino Greenland, MD.    Subjective:     Patient ID: Leonard Carter , male    DOB: [birth year] , 46 y.o.   MRN: [id number]   Chief Complaint  Patient presents with   Establish Care   Hypertension    HPI  Patient presents today to establish primary care. He reports his previous PCP is Dr.Moreira and it has been a few months since he has seen him. Patient would like to be seen for his blood pressure.  He reports h/o hypertension. He was prescribed lisinopril; however, does not wish to take it due to possibility of anaphylaxis and angioedema. He has several family members who are allergic to the medication. He is currently taking amlodipine without any issues.   He works at [employer]. 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 He is from [place], [place]. He reports h/o HTN. He is currently taking amlodipine. He left because he did not want to take lisinopril, due to possible side effects of angioedema. Denies h/o diabetes, depression and anxiety.   Hypertension This is a chronic problem. The current episode started more than 1 year ago. The problem has been gradually improving since onset. The problem is uncontrolled. Pertinent negatives include no blurred vision, chest pain, headaches or palpitations. Past treatments include calcium channel blockers.     Past Medical History:  Diagnosis Date   Asthma    Headache    Hypertension      Family History  Problem Relation Age of Onset   Hypertension Mother    Arthritis Mother    Kidney disease Mother    Arthritis Father    Ulcers Father    Diabetes Maternal Grandmother    Hypertension Maternal Grandmother    Dementia Maternal Grandmother    Arthritis Maternal Grandfather    Arthritis Paternal Grandmother    Ovarian cancer  Paternal Grandmother      Current Outpatient Medications:    topiramate (TOPAMAX) 50 MG tablet, Take 1 tablet (50 mg total) by mouth 2 (two) times daily., Disp: 180 tablet, Rfl: 4   amLODipine (NORVASC) 5 MG tablet, Take 1 tablet (5 mg total) by mouth daily., Disp: 90 tablet, Rfl: 2   No Known Allergies   Review of Systems  Constitutional: Negative.   Eyes:  Negative for blurred vision.  Respiratory: Negative.    Cardiovascular: Negative.  Negative for chest pain and palpitations.  Gastrointestinal: Negative.   Musculoskeletal:  Positive for arthralgias.       He c/o b/l knee pain. Denies fall/trauma. There is pain with ambulation. Also feels stiff after being seated for long periods of time.   Neurological: Negative.  Negative for headaches.  Psychiatric/Behavioral: Negative.       Today's Vitals   07/02/22 1511 07/02/22 1610  BP: (!) 150/90 (!) 142/98  Pulse: 94   Temp: 98.7 F (37.1 C)   Weight: 230 lb 3.2 oz (104.4 kg)   Height: 5' 7.8" (1.722 m)   PainSc: 2    PainLoc: Knee    Body mass index is 35.21 kg/m.  Wt Readings from Last 3 Encounters:  07/02/22 230 lb 3.2 oz (104.4 kg)  04/18/19 227 lb (103 kg)  08/26/17 231 lb 12.8 oz (105.1 kg)     Objective:  Physical Exam Vitals  and nursing note reviewed.  Constitutional:      Appearance: Normal appearance.  HENT:     Head: Normocephalic and atraumatic.     Nose:     Comments: Masked     Mouth/Throat:     Comments: Masked  Eyes:     Extraocular Movements: Extraocular movements intact.  Cardiovascular:     Rate and Rhythm: Normal rate and regular rhythm.     Heart sounds: Normal heart sounds.  Pulmonary:     Effort: Pulmonary effort is normal.     Breath sounds: Normal breath sounds.  Abdominal:     Palpations: Abdomen is soft.  Musculoskeletal:        General: No swelling or tenderness.     Cervical back: Normal range of motion.  Skin:    General: Skin is warm.  Neurological:     General: No focal  deficit present.     Mental Status: He is alert.  Psychiatric:        Mood and Affect: Mood normal.         Assessment And Plan:     1. Essential hypertension, benign Comments: Chronic, uncontrolled. He will c/w amlodipine for now. EKG performed, NSR w/o acute changes. He agrees to f/u in 2 weeks, if needed, I plan to add ARB to his regimen. He is also encouraged to follow DASH eating plan.  - EKG 12-Lead - CMP14+EGFR - CBC - Lipid panel - TSH - COMPLETE METABOLIC PANEL WITH GFR - POCT Urinalysis Dipstick (81002) - Microalbumin / Creatinine Urine Ratio  2. Other abnormal glucose Comments: He reports h/o prediabetes. I will check an a1c today, he is encouraged to limit his intake of sugary foods/drink.  - Hemoglobin A1c  3. Chronic pain of both knees Comments: He agrees to Ortho referral for further evaluation and radiographic studies. He may beneift from topical Voltaren gel to affected area BID.  - Ambulatory referral to Orthopedic Surgery  4. Tobacco use disorder Comments: He is encouraged to decrease number of cigs smoked per day.   5. Class 2 severe obesity due to excess calories with serious comorbidity and body mass index (BMI) of 35.0 to 35.9 in adult [facility]) Comments: He was congratulated on his weight loss thus far. He is encouraged to aim for BMI<30 to decrease cardiac risk.   6. Colon cancer screening Comments: He agrees to GI referral for CRC screening.   7. Influenza vaccination declined  8. Establishing care with new doctor, encounter for  9. Encounter for HCV screening test for low risk patient - Hepatitis C antibody   Patient was given opportunity to ask questions. Patient verbalized understanding of the plan and was able to repeat key elements of the plan. All questions were answered to their satisfaction.   I, Maximino Greenland, MD, have reviewed all documentation for this visit. The documentation on 07/04/22 for the exam, diagnosis, procedures, and orders  are all accurate and complete.   IF YOU HAVE BEEN REFERRED TO A SPECIALIST, IT MAY TAKE 1-2 WEEKS TO SCHEDULE/PROCESS THE REFERRAL. IF YOU HAVE NOT HEARD FROM US/SPECIALIST IN TWO WEEKS, PLEASE GIVE [place] A CALL AT [phone].   THE PATIENT IS ENCOURAGED TO PRACTICE SOCIAL DISTANCING DUE TO THE COVID-19 PANDEMIC.

## 2022-07-02 NOTE — Patient Instructions (Signed)

## 2022-07-21 ENCOUNTER — Encounter: Payer: Self-pay | Admitting: Internal Medicine

## 2022-07-22 ENCOUNTER — Encounter: Payer: Self-pay | Admitting: Internal Medicine

## 2022-07-22 ENCOUNTER — Ambulatory Visit: Payer: BC Managed Care – PPO

## 2022-07-22 VITALS — BP 150/88 | HR 69 | Temp 99.0°F

## 2022-07-22 DIAGNOSIS — I10 Essential (primary) hypertension: Secondary | ICD-10-CM

## 2022-07-22 MED ORDER — VALSARTAN 160 MG PO TABS: 160.0000 mg | ORAL_TABLET | Freq: Every day | ORAL | 1 refills | Status: DC

## 2022-07-22 NOTE — Patient Instructions (Signed)
Hypertension, Adult High blood pressure (hypertension) is when the force of blood pumping through the arteries is too strong. The arteries are the blood vessels that carry blood from the heart throughout the body. Hypertension forces the heart to work harder to pump blood and may cause arteries to become narrow or stiff. Untreated or uncontrolled hypertension can lead to a heart attack, heart failure, a stroke, kidney disease, and other problems. A blood pressure reading consists of a higher number over a lower number. Ideally, your blood pressure should be below 120/80. The first ("top") number is called the systolic pressure. It is a measure of the pressure in your arteries as your heart beats. The second ("bottom") number is called the diastolic pressure. It is a measure of the pressure in your arteries as the heart relaxes. What are the causes? The exact cause of this condition is not known. There are some conditions that result in high blood pressure. What increases the risk? Certain factors may make you more likely to develop high blood pressure. Some of these risk factors are under your control, including: Smoking. Not getting enough exercise or physical activity. Being overweight. Having too much fat, sugar, calories, or salt (sodium) in your diet. Drinking too much alcohol. Other risk factors include: Having a personal history of heart disease, diabetes, high cholesterol, or kidney disease. Stress. Having a family history of high blood pressure and high cholesterol. Having obstructive sleep apnea. Age. The risk increases with age. What are the signs or symptoms? High blood pressure may not cause symptoms. Very high blood pressure (hypertensive crisis) may cause: Headache. Fast or irregular heartbeats (palpitations). Shortness of breath. Nosebleed. Nausea and vomiting. Vision changes. Severe chest pain, dizziness, and seizures. How is this diagnosed? This condition is diagnosed by  measuring your blood pressure while you are seated, with your arm resting on a flat surface, your legs uncrossed, and your feet flat on the floor. The cuff of the blood pressure monitor will be placed directly against the skin of your upper arm at the level of your heart. Blood pressure should be measured at least twice using the same arm. Certain conditions can cause a difference in blood pressure between your right and left arms. If you have a high blood pressure reading during one visit or you have normal blood pressure with other risk factors, you may be asked to: Return on a different day to have your blood pressure checked again. Monitor your blood pressure at home for 1 week or longer. If you are diagnosed with hypertension, you may have other blood or imaging tests to help your health care provider understand your overall risk for other conditions. How is this treated? This condition is treated by making healthy lifestyle changes, such as eating healthy foods, exercising more, and reducing your alcohol intake. You may be referred for counseling on a healthy diet and physical activity. Your health care provider may prescribe medicine if lifestyle changes are not enough to get your blood pressure under control and if: Your systolic blood pressure is above 130. Your diastolic blood pressure is above 80. Your personal target blood pressure may vary depending on your medical conditions, your age, and other factors. Follow these instructions at home: Eating and drinking  Eat a diet that is high in fiber and potassium, and low in sodium, added sugar, and fat. An example of this eating plan is called the DASH diet. DASH stands for Dietary Approaches to Stop Hypertension. To eat this way: Eat   plenty of fresh fruits and vegetables. Try to fill one half of your plate at each meal with fruits and vegetables. Eat whole grains, such as whole-wheat pasta, brown rice, or whole-grain bread. Fill about one  fourth of your plate with whole grains. Eat or drink low-fat dairy products, such as skim milk or low-fat yogurt. Avoid fatty cuts of meat, processed or cured meats, and poultry with skin. Fill about one fourth of your plate with lean proteins, such as fish, chicken without skin, beans, eggs, or tofu. Avoid pre-made and processed foods. These tend to be higher in sodium, added sugar, and fat. Reduce your daily sodium intake. Many people with hypertension should eat less than 1,500 mg of sodium a day. Do not drink alcohol if: Your health care provider tells you not to drink. You are pregnant, may be pregnant, or are planning to become pregnant. If you drink alcohol: Limit how much you have to: 0-1 drink a day for women. 0-2 drinks a day for men. Know how much alcohol is in your drink. In the [place] one drink equals one 12 oz bottle of beer (355 mL), one 5 oz glass of wine (148 mL), or one 1 oz glass of hard liquor (44 mL). Lifestyle  Work with your health care provider to maintain a healthy body weight or to lose weight. Ask what an ideal weight is for you. Get at least 30 minutes of exercise that causes your heart to beat faster (aerobic exercise) most days of the week. Activities may include walking, swimming, or biking. Include exercise to strengthen your muscles (resistance exercise), such as Pilates or lifting weights, as part of your weekly exercise routine. Try to do these types of exercises for 30 minutes at least 3 days a week. Do not use any products that contain nicotine or tobacco. These products include cigarettes, chewing tobacco, and vaping devices, such as e-cigarettes. If you need help quitting, ask your health care provider. Monitor your blood pressure at home as told by your health care provider. Keep all follow-up visits. This is important. Medicines Take over-the-counter and prescription medicines only as told by your health care provider. Follow directions carefully. Blood  pressure medicines must be taken as prescribed. Do not skip doses of blood pressure medicine. Doing this puts you at risk for problems and can make the medicine less effective. Ask your health care provider about side effects or reactions to medicines that you should watch for. Contact a health care provider if you: Think you are having a reaction to a medicine you are taking. Have headaches that keep coming back (recurring). Feel dizzy. Have swelling in your ankles. Have trouble with your vision. Get help right away if you: Develop a severe headache or confusion. Have unusual weakness or numbness. Feel faint. Have severe pain in your chest or abdomen. Vomit repeatedly. Have trouble breathing. These symptoms may be an emergency. Get help right away. Call 911. Do not wait to see if the symptoms will go away. Do not drive yourself to the hospital. Summary Hypertension is when the force of blood pumping through your arteries is too strong. If this condition is not controlled, it may put you at risk for serious complications. Your personal target blood pressure may vary depending on your medical conditions, your age, and other factors. For most people, a normal blood pressure is less than 120/80. Hypertension is treated with lifestyle changes, medicines, or a combination of both. Lifestyle changes include losing weight, eating a healthy,   low-sodium diet, exercising more, and limiting alcohol. This information is not intended to replace advice given to you by your health care provider. Make sure you discuss any questions you have with your health care provider. Document Revised: 07/16/2021 Document Reviewed: 07/16/2021 Elsevier Patient Education  2023 Elsevier Inc.  

## 2022-07-22 NOTE — Progress Notes (Signed)
Patient presents today for BPC. He is currently taking Amlodipine 5mg .   BP Readings from Last 3 Encounters:  07/22/22 (!) 154/98  07/02/22 (!) 142/98  04/18/19 (!) 138/95   Provider would like to start valsartan 160mg  in the morning. Pt will return in 2 weeks for BPC and BMP

## 2022-08-05 ENCOUNTER — Ambulatory Visit: Payer: BC Managed Care – PPO

## 2022-08-05 VITALS — BP 138/84 | HR 71 | Temp 98.5°F

## 2022-08-05 DIAGNOSIS — I10 Essential (primary) hypertension: Secondary | ICD-10-CM

## 2022-08-05 MED ORDER — AMLODIPINE BESYLATE 10 MG PO TABS: 10.0000 mg | ORAL_TABLET | Freq: Every day | ORAL | 1 refills | Status: DC

## 2022-08-05 NOTE — Progress Notes (Signed)
Patient presents today for BPC. He is currently taking  amlodipine 5mg  and valsartan 160mg .   BP Readings from Last 3 Encounters:  08/05/22 138/84  07/22/22 (!) 150/88  07/02/22 (!) 142/98   Provider would like to increase amlodipine to 10mg  nightly. Pt will follow up as scheduled with provider. Encouraged to decrease salt intake and increase exercise.

## 2022-10-14 ENCOUNTER — Encounter: Payer: Self-pay | Admitting: Internal Medicine

## 2022-10-14 ENCOUNTER — Ambulatory Visit (INDEPENDENT_AMBULATORY_CARE_PROVIDER_SITE_OTHER): Payer: BC Managed Care – PPO | Admitting: Internal Medicine

## 2022-10-14 VITALS — BP 136/84 | HR 96 | Temp 98.8°F | Ht 67.0 in | Wt 244.4 lb

## 2022-10-14 DIAGNOSIS — Z1211 Encounter for screening for malignant neoplasm of colon: Secondary | ICD-10-CM

## 2022-10-14 DIAGNOSIS — R351 Nocturia: Secondary | ICD-10-CM

## 2022-10-14 DIAGNOSIS — Z6838 Body mass index (BMI) 38.0-38.9, adult: Secondary | ICD-10-CM

## 2022-10-14 DIAGNOSIS — R7309 Other abnormal glucose: Secondary | ICD-10-CM | POA: Diagnosis not present

## 2022-10-14 DIAGNOSIS — F17200 Nicotine dependence, unspecified, uncomplicated: Secondary | ICD-10-CM

## 2022-10-14 DIAGNOSIS — Z23 Encounter for immunization: Secondary | ICD-10-CM

## 2022-10-14 DIAGNOSIS — I10 Essential (primary) hypertension: Secondary | ICD-10-CM | POA: Diagnosis not present

## 2022-10-14 DIAGNOSIS — E6609 Other obesity due to excess calories: Secondary | ICD-10-CM

## 2022-10-14 NOTE — Progress Notes (Signed)
I,Victoria T Hamilton,acting as a scribe for Maximino Greenland, MD.,have documented all relevant documentation on the behalf of Maximino Greenland, MD,as directed by  Maximino Greenland, MD while in the presence of Maximino Greenland, MD.    Subjective:     Patient ID: Leonard Carter , male    DOB: [birth year] , 47 y.o.   MRN: [id number]   Chief Complaint  Patient presents with   Hypertension   abnormal glucose    HPI  Pt presents today for bp & abnormal glucose f/u. He denies headache, chest pain, SOB.  He reports recently feeling really cold. He states always being a hot natured person.   Hypertension This is a chronic problem. The current episode started more than 1 year ago. The problem has been gradually improving since onset. The problem is uncontrolled. Pertinent negatives include no blurred vision, chest pain, headaches or palpitations. Past treatments include calcium channel blockers.     Past Medical History:  Diagnosis Date   Asthma    Headache    Hypertension      Family History  Problem Relation Age of Onset   Hypertension Mother    Arthritis Mother    Kidney disease Mother    Arthritis Father    Ulcers Father    Diabetes Maternal Grandmother    Hypertension Maternal Grandmother    Dementia Maternal Grandmother    Arthritis Maternal Grandfather    Arthritis Paternal Grandmother    Ovarian cancer Paternal Grandmother      Current Outpatient Medications:    amLODipine (NORVASC) 10 MG tablet, Take 1 tablet (10 mg total) by mouth daily., Disp: 90 tablet, Rfl: 1   meloxicam (MOBIC) 15 MG tablet, Take 15 mg by mouth daily., Disp: , Rfl:    topiramate (TOPAMAX) 50 MG tablet, Take 1 tablet (50 mg total) by mouth 2 (two) times daily., Disp: 180 tablet, Rfl: 4   valsartan (DIOVAN) 160 MG tablet, Take 1 tablet (160 mg total) by mouth daily., Disp: 90 tablet, Rfl: 1   No Known Allergies   Review of Systems  Constitutional: Negative.   Eyes: Negative.  Negative for blurred  vision.  Cardiovascular:  Negative for chest pain and palpitations.  Gastrointestinal: Negative.   Genitourinary: Negative.   Skin: Negative.   Allergic/Immunologic: Negative.   Neurological:  Negative for headaches.  Hematological: Negative.      Today's Vitals   10/14/22 1603  BP: 136/84  Pulse: 96  Temp: 98.8 F (37.1 C)  SpO2: 98%  Weight: 244 lb 6.4 oz (110.9 kg)  Height: 5\' 7"  (1.702 m)   Body mass index is 38.28 kg/m.  Wt Readings from Last 3 Encounters:  10/14/22 244 lb 6.4 oz (110.9 kg)  07/02/22 230 lb 3.2 oz (104.4 kg)  04/18/19 227 lb (103 kg)    Objective:  Physical Exam Vitals and nursing note reviewed.  Constitutional:      Appearance: Normal appearance.  HENT:     Head: Normocephalic and atraumatic.     Nose:     Comments: MASKED     Mouth/Throat:     Comments: MASKED Eyes:     Extraocular Movements: Extraocular movements intact.  Cardiovascular:     Rate and Rhythm: Normal rate and regular rhythm.     Heart sounds: Normal heart sounds.  Pulmonary:     Effort: Pulmonary effort is normal.     Breath sounds: Normal breath sounds.  Musculoskeletal:     Cervical back: Normal range  of motion.  Skin:    General: Skin is warm.  Neurological:     General: No focal deficit present.     Mental Status: He is alert.  Psychiatric:        Mood and Affect: Mood normal.         Assessment And Plan:     1. Essential hypertension, benign  2. Other abnormal glucose  3. Tobacco use disorder  4. Need for Tdap vaccination - Tdap vaccine greater than or equal to 7yo IM  5. Class 2 obesity due to excess calories without serious comorbidity with body mass index (BMI) of 38.0 to 38.9 in adult He is encouraged to strive for BMI less than 30 to decrease cardiac risk. Advised to aim for at least 150 minutes of exercise per week.    Patient was given opportunity to ask questions. Patient verbalized understanding of the plan and was able to repeat key  elements of the plan. All questions were answered to their satisfaction.   I, Maximino Greenland, MD, have reviewed all documentation for this visit. The documentation on 10/14/22 for the exam, diagnosis, procedures, and orders are all accurate and complete.   IF YOU HAVE BEEN REFERRED TO A SPECIALIST, IT MAY TAKE 1-2 WEEKS TO SCHEDULE/PROCESS THE REFERRAL. IF YOU HAVE NOT HEARD FROM US/SPECIALIST IN TWO WEEKS, PLEASE GIVE [place] A CALL AT [phone].   THE PATIENT IS ENCOURAGED TO PRACTICE SOCIAL DISTANCING DUE TO THE COVID-19 PANDEMIC.

## 2022-10-14 NOTE — Patient Instructions (Signed)
Hypertension, Adult Hypertension is another name for high blood pressure. High blood pressure forces your heart to work harder to pump blood. This can cause problems over time. There are two numbers in a blood pressure reading. There is a top number (systolic) over a bottom number (diastolic). It is best to have a blood pressure that is below 120/80. What are the causes? The cause of this condition is not known. Some other conditions can lead to high blood pressure. What increases the risk? Some lifestyle factors can make you more likely to develop high blood pressure: Smoking. Not getting enough exercise or physical activity. Being overweight. Having too much fat, sugar, calories, or salt (sodium) in your diet. Drinking too much alcohol. Other risk factors include: Having any of these conditions: Heart disease. Diabetes. High cholesterol. Kidney disease. Obstructive sleep apnea. Having a family history of high blood pressure and high cholesterol. Age. The risk increases with age. Stress. What are the signs or symptoms? High blood pressure may not cause symptoms. Very high blood pressure (hypertensive crisis) may cause: Headache. Fast or uneven heartbeats (palpitations). Shortness of breath. Nosebleed. Vomiting or feeling like you may vomit (nauseous). Changes in how you see. Very bad chest pain. Feeling dizzy. Seizures. How is this treated? This condition is treated by making healthy lifestyle changes, such as: Eating healthy foods. Exercising more. Drinking less alcohol. Your doctor may prescribe medicine if lifestyle changes do not help enough and if: Your top number is above 130. Your bottom number is above 80. Your personal target blood pressure may vary. Follow these instructions at home: Eating and drinking  If told, follow the DASH eating plan. To follow this plan: Fill one half of your plate at each meal with fruits and vegetables. Fill one fourth of your plate  at each meal with whole grains. Whole grains include whole-wheat pasta, brown rice, and whole-grain bread. Eat or drink low-fat dairy products, such as skim milk or low-fat yogurt. Fill one fourth of your plate at each meal with low-fat (lean) proteins. Low-fat proteins include fish, chicken without skin, eggs, beans, and tofu. Avoid fatty meat, cured and processed meat, or chicken with skin. Avoid pre-made or processed food. Limit the amount of salt in your diet to less than 1,500 mg each day. Do not drink alcohol if: Your doctor tells you not to drink. You are pregnant, may be pregnant, or are planning to become pregnant. If you drink alcohol: Limit how much you have to: 0-1 drink a day for women. 0-2 drinks a day for men. Know how much alcohol is in your drink. In the [place] one drink equals one 12 oz bottle of beer (355 mL), one 5 oz glass of wine (148 mL), or one 1 oz glass of hard liquor (44 mL). Lifestyle  Work with your doctor to stay at a healthy weight or to lose weight. Ask your doctor what the best weight is for you. Get at least 30 minutes of exercise that causes your heart to beat faster (aerobic exercise) most days of the week. This may include walking, swimming, or biking. Get at least 30 minutes of exercise that strengthens your muscles (resistance exercise) at least 3 days a week. This may include lifting weights or doing Pilates. Do not smoke or use any products that contain nicotine or tobacco. If you need help quitting, ask your doctor. Check your blood pressure at home as told by your doctor. Keep all follow-up visits. Medicines Take over-the-counter and prescription medicines  only as told by your doctor. Follow directions carefully. ?Do not skip doses of blood pressure medicine. The medicine does not work as well if you skip doses. Skipping doses also puts you at risk for problems. ?Ask your doctor about side effects or reactions to medicines that you should watch  for. ?Contact a doctor if: ?You think you are having a reaction to the medicine you are taking. ?You have headaches that keep coming back. ?You feel dizzy. ?You have swelling in your ankles. ?You have trouble with your vision. ?Get help right away if: ?You get a very bad headache. ?You start to feel mixed up (confused). ?You feel weak or numb. ?You feel faint. ?You have very bad pain in your: ?Chest. ?Belly (abdomen). ?You vomit more than once. ?You have trouble breathing. ?These symptoms may be an emergency. Get help right away. Call 911. ?Do not wait to see if the symptoms will go away. ?Do not drive yourself to the hospital. ?Summary ?Hypertension is another name for high blood pressure. ?High blood pressure forces your heart to work harder to pump blood. ?For most people, a normal blood pressure is less than 120/80. ?Making healthy choices can help lower blood pressure. If your blood pressure does not get lower with healthy choices, you may need to take medicine. ?This information is not intended to replace advice given to you by your health care provider. Make sure you discuss any questions you have with your health care provider. ?Document Revised: 06/27/2021 Document Reviewed: 06/27/2021 ?Elsevier Patient Education ? 2023 Elsevier Inc. ? ?

## 2022-10-15 LAB — PSA: PSA: 0.55 ng/mL (ref <=4.00)

## 2022-10-20 ENCOUNTER — Other Ambulatory Visit: Payer: Self-pay

## 2022-10-20 DIAGNOSIS — N289 Disorder of kidney and ureter, unspecified: Secondary | ICD-10-CM

## 2022-10-20 LAB — COMPREHENSIVE METABOLIC PANEL
AG Ratio: 1.7 (calc) (ref 1.0–2.5)
ALT: 10 U/L (ref 9–46)
AST: 12 U/L (ref 10–40)
Albumin: 3.9 g/dL (ref 3.6–5.1)
Alkaline phosphatase (APISO): 66 U/L (ref 36–130)
BUN/Creatinine Ratio: 14 (calc) (ref 6–22)
BUN: 21 mg/dL (ref 7–25)
CO2: 26 mmol/L (ref 20–32)
Calcium: 8.8 mg/dL (ref 8.6–10.3)
Chloride: 112 mmol/L — ABNORMAL HIGH (ref 98–110)
Creat: 1.53 mg/dL — ABNORMAL HIGH (ref 0.60–1.29)
Globulin: 2.3 g/dL (calc) (ref 1.9–3.7)
Glucose, Bld: 107 mg/dL — ABNORMAL HIGH (ref 65–99)
Potassium: 3.9 mmol/L (ref 3.5–5.3)
Sodium: 144 mmol/L (ref 135–146)
Total Bilirubin: 0.4 mg/dL (ref 0.2–1.2)
Total Protein: 6.2 g/dL (ref 6.1–8.1)

## 2022-10-20 LAB — CREATININE WITH EST GFR
Creat: 1.53 mg/dL — ABNORMAL HIGH (ref 0.60–1.29)
eGFR: 56 mL/min/{1.73_m2} — ABNORMAL LOW (ref >=60)

## 2022-10-20 LAB — HEMOGLOBIN A1C
Hgb A1c MFr Bld: 6.1 % of total Hgb — ABNORMAL HIGH (ref <5.7)
Mean Plasma Glucose: 128 mg/dL
eAG (mmol/L): 7.1 mmol/L

## 2022-10-20 LAB — TEST AUTHORIZATION

## 2022-11-07 ENCOUNTER — Encounter: Payer: Self-pay | Admitting: Internal Medicine

## 2022-11-07 ENCOUNTER — Other Ambulatory Visit: Payer: Self-pay | Admitting: Internal Medicine

## 2022-11-07 DIAGNOSIS — N289 Disorder of kidney and ureter, unspecified: Secondary | ICD-10-CM

## 2022-11-10 ENCOUNTER — Other Ambulatory Visit: Payer: BC Managed Care – PPO

## 2022-11-10 DIAGNOSIS — N289 Disorder of kidney and ureter, unspecified: Secondary | ICD-10-CM

## 2022-11-11 ENCOUNTER — Other Ambulatory Visit: Payer: Self-pay

## 2022-11-11 LAB — BASIC METABOLIC PANEL WITH GFR
BUN/Creatinine Ratio: 11 (calc) (ref 6–22)
BUN: 19 mg/dL (ref 7–25)
CO2: 24 mmol/L (ref 20–32)
Calcium: 8.8 mg/dL (ref 8.6–10.3)
Chloride: 112 mmol/L — ABNORMAL HIGH (ref 98–110)
Creat: 1.66 mg/dL — ABNORMAL HIGH (ref 0.60–1.29)
Glucose, Bld: 93 mg/dL (ref 65–99)
Potassium: 4.2 mmol/L (ref 3.5–5.3)
Sodium: 142 mmol/L (ref 135–146)
eGFR: 51 mL/min/{1.73_m2} — ABNORMAL LOW (ref >=60)

## 2022-11-11 LAB — EXTRA LAV TOP TUBE

## 2022-11-20 ENCOUNTER — Encounter: Payer: Self-pay | Admitting: Internal Medicine

## 2022-12-09 LAB — HM COLONOSCOPY

## 2023-01-21 ENCOUNTER — Other Ambulatory Visit: Payer: Self-pay | Admitting: Internal Medicine

## 2023-02-12 ENCOUNTER — Other Ambulatory Visit: Payer: Self-pay | Admitting: Internal Medicine

## 2023-02-12 ENCOUNTER — Ambulatory Visit: Payer: BC Managed Care – PPO | Admitting: Internal Medicine

## 2023-02-12 VITALS — BP 140/90 | HR 67 | Temp 98.6°F | Ht 67.0 in | Wt 249.8 lb

## 2023-02-12 DIAGNOSIS — I10 Essential (primary) hypertension: Secondary | ICD-10-CM

## 2023-02-12 DIAGNOSIS — M25562 Pain in left knee: Secondary | ICD-10-CM

## 2023-02-12 DIAGNOSIS — R7309 Other abnormal glucose: Secondary | ICD-10-CM | POA: Diagnosis not present

## 2023-02-12 DIAGNOSIS — Z6839 Body mass index (BMI) 39.0-39.9, adult: Secondary | ICD-10-CM

## 2023-02-12 DIAGNOSIS — N289 Disorder of kidney and ureter, unspecified: Secondary | ICD-10-CM | POA: Diagnosis not present

## 2023-02-12 DIAGNOSIS — M25561 Pain in right knee: Secondary | ICD-10-CM | POA: Diagnosis not present

## 2023-02-12 DIAGNOSIS — G8929 Other chronic pain: Secondary | ICD-10-CM

## 2023-02-12 NOTE — Addendum Note (Signed)
Addended by: Randa Lynn T on: 02/12/2023 04:13 PM   Modules accepted: Orders

## 2023-02-12 NOTE — Progress Notes (Signed)
Hershal Coria Martin,acting as a Neurosurgeon for Gwynneth Aliment, MD.,have documented all relevant documentation on the behalf of Gwynneth Aliment, MD,as directed by  Gwynneth Aliment, MD while in the presence of Gwynneth Aliment, MD.    Subjective:     Patient ID: Leonard Carter , male    DOB: [birth year] , 47 y.o.   MRN: [id number]   Chief Complaint  Patient presents with   Hypertension    HPI  Pt presents today for bp & abnormal glucose f/u.  He reports compliance with meds.  He admits to having a headaches more frequently.  He does have h/o migraines. He denies chest pain and SOB.  Patient reports his knees are hurting again. This has affected his ability to exercise. He would like to resume use of meloxicam.   BP Readings from Last 3 Encounters: 02/12/23 : (!) 140/90 10/14/22 : 136/84 08/05/22 : 138/84     Hypertension This is a chronic problem. The current episode started more than 1 year ago. The problem has been gradually improving since onset. The problem is uncontrolled. Pertinent negatives include no blurred vision, chest pain, headaches or palpitations. Past treatments include calcium channel blockers.     Past Medical History:  Diagnosis Date   Asthma    Headache    Hypertension      Family History  Problem Relation Age of Onset   Hypertension Mother    Arthritis Mother    Kidney disease Mother    Arthritis Father    Ulcers Father    Diabetes Maternal Grandmother    Hypertension Maternal Grandmother    Dementia Maternal Grandmother    Arthritis Maternal Grandfather    Arthritis Paternal Grandmother    Ovarian cancer Paternal Grandmother      Current Outpatient Medications:    amLODipine (NORVASC) 10 MG tablet, Take 1 tablet (10 mg total) by mouth daily., Disp: 90 tablet, Rfl: 1   meloxicam (MOBIC) 15 MG tablet, Take 15 mg by mouth daily., Disp: , Rfl:    topiramate (TOPAMAX) 50 MG tablet, Take 1 tablet (50 mg total) by mouth 2 (two) times daily., Disp: 180  tablet, Rfl: 4   valsartan (DIOVAN) 160 MG tablet, TAKE 1 TABLET(160 MG) BY MOUTH DAILY, Disp: 90 tablet, Rfl: 1   No Known Allergies   Review of Systems  Constitutional: Negative.   Eyes:  Negative for blurred vision.  Respiratory: Negative.    Cardiovascular: Negative.  Negative for chest pain and palpitations.  Gastrointestinal: Negative.   Skin: Negative.   Neurological: Negative.  Negative for headaches.  Psychiatric/Behavioral: Negative.       Today's Vitals   02/12/23 1520  BP: (!) 140/90  Pulse: 67  Temp: 98.6 F (37 C)  TempSrc: Oral  Weight: 249 lb 12.8 oz (113.3 kg)  Height: 5\' 7"  (1.702 m)  PainSc: 6   PainLoc: Knee   Body mass index is 39.12 kg/m.  Wt Readings from Last 3 Encounters:  02/12/23 249 lb 12.8 oz (113.3 kg)  10/14/22 244 lb 6.4 oz (110.9 kg)  07/02/22 230 lb 3.2 oz (104.4 kg)   BP Readings from Last 3 Encounters:  02/12/23 (!) 140/90  10/14/22 136/84  08/05/22 138/84     The 10-year ASCVD risk score (Arnett DK, et al., 2019) is: 13.8%*   Values used to calculate the score:     Age: 66 years     Sex: Male     Is Non-Hispanic African American: Yes  Diabetic: No     Tobacco smoker: Yes     Systolic Blood Pressure: 140 mmHg     Is BP treated: Yes     HDL Cholesterol: 48 mg/dL*     Total Cholesterol: 173 mg/dL*     * - Cholesterol units were assumed for this score calculation ++ Objective:  Physical Exam Vitals and nursing note reviewed.  Constitutional:      Appearance: Normal appearance. He is obese.  HENT:     Head: Normocephalic and atraumatic.  Eyes:     Extraocular Movements: Extraocular movements intact.  Cardiovascular:     Rate and Rhythm: Normal rate and regular rhythm.     Heart sounds: Normal heart sounds.  Pulmonary:     Effort: Pulmonary effort is normal.     Breath sounds: Normal breath sounds.  Musculoskeletal:     Cervical back: Normal range of motion.  Skin:    General: Skin is warm.  Neurological:      General: No focal deficit present.     Mental Status: He is alert.  Psychiatric:        Mood and Affect: Mood normal.         Assessment And Plan:     1. Essential hypertension, benign Comments: Uncontrolled. My  2. Other abnormal glucose  3. Class 2 severe obesity due to excess calories with serious comorbidity and body mass index (BMI) of 39.0 to 39.9 in adult [facility])    Return for 6 month bp check.  Patient was given opportunity to ask questions. Patient verbalized understanding of the plan and was able to repeat key elements of the plan. All questions were answered to their satisfaction.   I, Gwynneth Aliment, MD, have reviewed all documentation for this visit. The documentation on 02/12/23 for the exam, diagnosis, procedures, and orders are all accurate and complete.   IF YOU HAVE BEEN REFERRED TO A SPECIALIST, IT MAY TAKE 1-2 WEEKS TO SCHEDULE/PROCESS THE REFERRAL. IF YOU HAVE NOT HEARD FROM US/SPECIALIST IN TWO WEEKS, PLEASE GIVE [place] A CALL AT [phone].   THE PATIENT IS ENCOURAGED TO PRACTICE SOCIAL DISTANCING DUE TO THE COVID-19 PANDEMIC.

## 2023-02-12 NOTE — Addendum Note (Signed)
Addended by: Randa Lynn T on: 02/12/2023 05:01 PM   Modules accepted: Orders

## 2023-02-12 NOTE — Patient Instructions (Signed)
Hypertension, Adult Hypertension is another name for high blood pressure. High blood pressure forces your heart to work harder to pump blood. This can cause problems over time. There are two numbers in a blood pressure reading. There is a top number (systolic) over a bottom number (diastolic). It is best to have a blood pressure that is below 120/80. What are the causes? The cause of this condition is not known. Some other conditions can lead to high blood pressure. What increases the risk? Some lifestyle factors can make you more likely to develop high blood pressure: Smoking. Not getting enough exercise or physical activity. Being overweight. Having too much fat, sugar, calories, or salt (sodium) in your diet. Drinking too much alcohol. Other risk factors include: Having any of these conditions: Heart disease. Diabetes. High cholesterol. Kidney disease. Obstructive sleep apnea. Having a family history of high blood pressure and high cholesterol. Age. The risk increases with age. Stress. What are the signs or symptoms? High blood pressure may not cause symptoms. Very high blood pressure (hypertensive crisis) may cause: Headache. Fast or uneven heartbeats (palpitations). Shortness of breath. Nosebleed. Vomiting or feeling like you may vomit (nauseous). Changes in how you see. Very bad chest pain. Feeling dizzy. Seizures. How is this treated? This condition is treated by making healthy lifestyle changes, such as: Eating healthy foods. Exercising more. Drinking less alcohol. Your doctor may prescribe medicine if lifestyle changes do not help enough and if: Your top number is above 130. Your bottom number is above 80. Your personal target blood pressure may vary. Follow these instructions at home: Eating and drinking  If told, follow the DASH eating plan. To follow this plan: Fill one half of your plate at each meal with fruits and vegetables. Fill one fourth of your plate  at each meal with whole grains. Whole grains include whole-wheat pasta, brown rice, and whole-grain bread. Eat or drink low-fat dairy products, such as skim milk or low-fat yogurt. Fill one fourth of your plate at each meal with low-fat (lean) proteins. Low-fat proteins include fish, chicken without skin, eggs, beans, and tofu. Avoid fatty meat, cured and processed meat, or chicken with skin. Avoid pre-made or processed food. Limit the amount of salt in your diet to less than 1,500 mg each day. Do not drink alcohol if: Your doctor tells you not to drink. You are pregnant, may be pregnant, or are planning to become pregnant. If you drink alcohol: Limit how much you have to: 0-1 drink a day for women. 0-2 drinks a day for men. Know how much alcohol is in your drink. In the [place] one drink equals one 12 oz bottle of beer (355 mL), one 5 oz glass of wine (148 mL), or one 1 oz glass of hard liquor (44 mL). Lifestyle  Work with your doctor to stay at a healthy weight or to lose weight. Ask your doctor what the best weight is for you. Get at least 30 minutes of exercise that causes your heart to beat faster (aerobic exercise) most days of the week. This may include walking, swimming, or biking. Get at least 30 minutes of exercise that strengthens your muscles (resistance exercise) at least 3 days a week. This may include lifting weights or doing Pilates. Do not smoke or use any products that contain nicotine or tobacco. If you need help quitting, ask your doctor. Check your blood pressure at home as told by your doctor. Keep all follow-up visits. Medicines Take over-the-counter and prescription medicines  only as told by your doctor. Follow directions carefully. Do not skip doses of blood pressure medicine. The medicine does not work as well if you skip doses. Skipping doses also puts you at risk for problems. Ask your doctor about side effects or reactions to medicines that you should watch  for. Contact a doctor if: You think you are having a reaction to the medicine you are taking. You have headaches that keep coming back. You feel dizzy. You have swelling in your ankles. You have trouble with your vision. Get help right away if: You get a very bad headache. You start to feel mixed up (confused). You feel weak or numb. You feel faint. You have very bad pain in your: Chest. Belly (abdomen). You vomit more than once. You have trouble breathing. These symptoms may be an emergency. Get help right away. Call 911. Do not wait to see if the symptoms will go away. Do not drive yourself to the hospital. Summary Hypertension is another name for high blood pressure. High blood pressure forces your heart to work harder to pump blood. For most people, a normal blood pressure is less than 120/80. Making healthy choices can help lower blood pressure. If your blood pressure does not get lower with healthy choices, you may need to take medicine. This information is not intended to replace advice given to you by your health care provider. Make sure you discuss any questions you have with your health care provider. Document Revised: 06/27/2021 Document Reviewed: 06/27/2021 Elsevier Patient Education  2024 [employer].

## 2023-02-13 LAB — ALBUMIN, URINE, RANDOM
Albumin, Urine POC: 1.4
Creatinine, POC: 166 mg/dL
Microalb Creat Ratio: 8

## 2023-02-18 LAB — COMPLETE METABOLIC PANEL WITH GFR
AG Ratio: 1.8 (calc) (ref 1.0–2.5)
ALT: 9 U/L (ref 9–46)
AST: 12 U/L (ref 10–40)
Albumin: 4.1 g/dL (ref 3.6–5.1)
Alkaline phosphatase (APISO): 68 U/L (ref 36–130)
BUN/Creatinine Ratio: 14 (calc) (ref 6–22)
BUN: 24 mg/dL (ref 7–25)
CO2: 24 mmol/L (ref 20–32)
Calcium: 9.1 mg/dL (ref 8.6–10.3)
Chloride: 111 mmol/L — ABNORMAL HIGH (ref 98–110)
Creat: 1.77 mg/dL — ABNORMAL HIGH (ref 0.60–1.29)
Globulin: 2.3 g/dL (calc) (ref 1.9–3.7)
Glucose, Bld: 91 mg/dL (ref 65–99)
Potassium: 3.8 mmol/L (ref 3.5–5.3)
Sodium: 141 mmol/L (ref 135–146)
Total Bilirubin: 0.3 mg/dL (ref 0.2–1.2)
Total Protein: 6.4 g/dL (ref 6.1–8.1)
eGFR: 47 mL/min/{1.73_m2} — ABNORMAL LOW (ref >=60)

## 2023-02-18 LAB — HEMOGLOBIN A1C
Hgb A1c MFr Bld: 6.1 % of total Hgb — ABNORMAL HIGH (ref <5.7)
Mean Plasma Glucose: 128 mg/dL
eAG (mmol/L): 7.1 mmol/L

## 2023-02-18 LAB — PROTEIN ELECTROPHORESIS, SERUM, WITH REFLEX
Albumin ELP: 4 g/dL (ref 3.8–4.8)
Alpha 1: 0.3 g/dL (ref 0.2–0.3)
Alpha 2: 0.6 g/dL (ref 0.5–0.9)
Beta 2: 0.4 g/dL (ref 0.2–0.5)
Beta Globulin: 0.4 g/dL (ref 0.4–0.6)
Gamma Globulin: 0.9 g/dL (ref 0.8–1.7)
Total Protein: 6.5 g/dL (ref 6.1–8.1)

## 2023-02-18 LAB — CLIENT EDUCATION TRACKING

## 2023-02-18 LAB — HOUSE ACCOUNT TRACKING

## 2023-02-18 LAB — VITAMIN D 25 HYDROXY (VIT D DEFICIENCY, FRACTURES): Vit D, 25-Hydroxy: 21 ng/mL — ABNORMAL LOW (ref 30–100)

## 2023-02-23 ENCOUNTER — Other Ambulatory Visit: Payer: Self-pay | Admitting: Internal Medicine

## 2023-04-15 ENCOUNTER — Encounter: Payer: Self-pay | Admitting: Internal Medicine

## 2023-06-03 NOTE — Patient Instructions (Signed)
Health Maintenance, Male Adopting a healthy lifestyle and getting preventive care are important in promoting health and wellness. Ask your health care provider about: The right schedule for you to have regular tests and exams. Things you can do on your own to prevent diseases and keep yourself healthy. What should I know about diet, weight, and exercise? Eat a healthy diet  Eat a diet that includes plenty of vegetables, fruits, low-fat dairy products, and lean protein. Do not eat a lot of foods that are high in solid fats, added sugars, or sodium. Maintain a healthy weight Body mass index (BMI) is a measurement that can be used to identify possible weight problems. It estimates body fat based on height and weight. Your health care provider can help determine your BMI and help you achieve or maintain a healthy weight. Get regular exercise Get regular exercise. This is one of the most important things you can do for your health. Most adults should: Exercise for at least 150 minutes each week. The exercise should increase your heart rate and make you sweat (moderate-intensity exercise). Do strengthening exercises at least twice a week. This is in addition to the moderate-intensity exercise. Spend less time sitting. Even light physical activity can be beneficial. Watch cholesterol and blood lipids Have your blood tested for lipids and cholesterol at 46 years of age, then have this test every 5 years. You may need to have your cholesterol levels checked more often if: Your lipid or cholesterol levels are high. You are older than 46 years of age. You are at high risk for heart disease. What should I know about cancer screening? Many types of cancers can be detected early and may often be prevented. Depending on your health history and family history, you may need to have cancer screening at various ages. This may include screening for: Colorectal cancer. Prostate cancer. Skin cancer. Lung  cancer. What should I know about heart disease, diabetes, and high blood pressure? Blood pressure and heart disease High blood pressure causes heart disease and increases the risk of stroke. This is more likely to develop in people who have high blood pressure readings or are overweight. Talk with your health care provider about your target blood pressure readings. Have your blood pressure checked: Every 3-5 years if you are 18-39 years of age. Every year if you are 40 years old or older. If you are between the ages of 65 and 75 and are a current or former smoker, ask your health care provider if you should have a one-time screening for abdominal aortic aneurysm (AAA). Diabetes Have regular diabetes screenings. This checks your fasting blood sugar level. Have the screening done: Once every three years after age 45 if you are at a normal weight and have a low risk for diabetes. More often and at a younger age if you are overweight or have a high risk for diabetes. What should I know about preventing infection? Hepatitis B If you have a higher risk for hepatitis B, you should be screened for this virus. Talk with your health care provider to find out if you are at risk for hepatitis B infection. Hepatitis C Blood testing is recommended for: Everyone born from 1945 through 1965. Anyone with known risk factors for hepatitis C. Sexually transmitted infections (STIs) You should be screened each year for STIs, including gonorrhea and chlamydia, if: You are sexually active and are younger than 46 years of age. You are older than 46 years of age and your   health care provider tells you that you are at risk for this type of infection. Your sexual activity has changed since you were last screened, and you are at increased risk for chlamydia or gonorrhea. Ask your health care provider if you are at risk. Ask your health care provider about whether you are at high risk for HIV. Your health care provider  may recommend a prescription medicine to help prevent HIV infection. If you choose to take medicine to prevent HIV, you should first get tested for HIV. You should then be tested every 3 months for as long as you are taking the medicine. Follow these instructions at home: Alcohol use Do not drink alcohol if your health care provider tells you not to drink. If you drink alcohol: Limit how much you have to 0-2 drinks a day. Know how much alcohol is in your drink. In the [place] one drink equals one 12 oz bottle of beer (355 mL), one 5 oz glass of wine (148 mL), or one 1 oz glass of hard liquor (44 mL). Lifestyle Do not use any products that contain nicotine or tobacco. These products include cigarettes, chewing tobacco, and vaping devices, such as e-cigarettes. If you need help quitting, ask your health care provider. Do not use street drugs. Do not share needles. Ask your health care provider for help if you need support or information about quitting drugs. General instructions Schedule regular health, dental, and eye exams. Stay current with your vaccines. Tell your health care provider if: You often feel depressed. You have ever been abused or do not feel safe at home. Summary Adopting a healthy lifestyle and getting preventive care are important in promoting health and wellness. Follow your health care provider's instructions about healthy diet, exercising, and getting tested or screened for diseases. Follow your health care provider's instructions on monitoring your cholesterol and blood pressure. This information is not intended to replace advice given to you by your health care provider. Make sure you discuss any questions you have with your health care provider. Document Revised: 01/28/2021 Document Reviewed: 01/28/2021 Elsevier Patient Education  2024 [employer]  

## 2023-06-04 ENCOUNTER — Encounter: Payer: Self-pay | Admitting: Internal Medicine

## 2023-06-04 ENCOUNTER — Ambulatory Visit (INDEPENDENT_AMBULATORY_CARE_PROVIDER_SITE_OTHER): Payer: BC Managed Care – PPO | Admitting: Internal Medicine

## 2023-06-04 VITALS — BP 138/100 | HR 71 | Temp 98.0°F | Ht 67.0 in | Wt 251.6 lb

## 2023-06-04 DIAGNOSIS — R0683 Snoring: Secondary | ICD-10-CM | POA: Diagnosis not present

## 2023-06-04 DIAGNOSIS — Z0000 Encounter for general adult medical examination without abnormal findings: Secondary | ICD-10-CM | POA: Diagnosis not present

## 2023-06-04 DIAGNOSIS — N1831 Chronic kidney disease, stage 3a: Secondary | ICD-10-CM | POA: Insufficient documentation

## 2023-06-04 DIAGNOSIS — N289 Disorder of kidney and ureter, unspecified: Secondary | ICD-10-CM

## 2023-06-04 DIAGNOSIS — I129 Hypertensive chronic kidney disease with stage 1 through stage 4 chronic kidney disease, or unspecified chronic kidney disease: Secondary | ICD-10-CM | POA: Diagnosis not present

## 2023-06-04 DIAGNOSIS — Z6839 Body mass index (BMI) 39.0-39.9, adult: Secondary | ICD-10-CM

## 2023-06-04 DIAGNOSIS — I10 Essential (primary) hypertension: Secondary | ICD-10-CM

## 2023-06-04 DIAGNOSIS — R7309 Other abnormal glucose: Secondary | ICD-10-CM

## 2023-06-04 LAB — POCT URINALYSIS DIPSTICK
Bilirubin, UA: NEGATIVE
Blood, UA: NEGATIVE
Glucose, UA: NEGATIVE
Ketones, UA: NEGATIVE
Leukocytes, UA: NEGATIVE
Nitrite, UA: NEGATIVE
Protein, UA: POSITIVE — AB
Spec Grav, UA: >=1.03 — AB (ref 1.010–1.025)
Urobilinogen, UA: 0.2 U/dL
pH, UA: 6.5 (ref 5.0–8.0)

## 2023-06-04 NOTE — Assessment & Plan Note (Signed)
Chronic, uncontrolled. EKG performed, NSR w/o acute changes.  He will continue with amlodipine 10mg  daily and valsartan 160mg  daily for now. I think he may benefit from diuretic. It is important that he stay well hydrated now and especially if diuretic is added to his current regimen. Will review labs and then make further recommendations.

## 2023-06-04 NOTE — Assessment & Plan Note (Signed)
He states he has been diagnosed with OSA in the past. He does not wish to pursue CPAP therapy. He is encouraged to avoid lying on his back and to try to sleep on his side.  Weight loss may be effective in improving his sx.

## 2023-06-04 NOTE — Assessment & Plan Note (Addendum)
Chronic, he is encouraged to stay well hydrated, avoid NSAIDs and keep BP controlled to prevent progression of CKD.  I will schedule him for renal u/s. He also reports his mother had ESRD and is s/p renal transplant. I will refer him to Nephrology for further evaluation. He is in agreement with his treatment plan.

## 2023-06-04 NOTE — Assessment & Plan Note (Signed)

## 2023-06-04 NOTE — Assessment & Plan Note (Signed)
He is encouraged to initially strive for BMI less than 30 to decrease cardiac risk. He is advised to exercise no less than 150 minutes per week.

## 2023-06-04 NOTE — Progress Notes (Signed)
I,Victoria T Deloria Lair, CMA,acting as a Neurosurgeon for Gwynneth Aliment, MD.,have documented all relevant documentation on the behalf of Gwynneth Aliment, MD,as directed by  Gwynneth Aliment, MD while in the presence of Gwynneth Aliment, MD.  Subjective:   Patient ID: Leonard Carter , male    DOB: 12/26/1975 , 47 y.o.   MRN: [id number]  Chief Complaint  Patient presents with   Annual Exam   Hypertension    HPI  Patient presents today for annual exam. He reports compliance with medication. Denies headaches, chest pain & SOB.   He reports a bump on the right side above his eye. He admits noticing this months ago. Denies pain & drainage.   Hypertension This is a chronic problem. The current episode started more than 1 year ago. The problem has been gradually improving since onset. The problem is uncontrolled. Pertinent negatives include no blurred vision, chest pain, headaches or palpitations. Past treatments include calcium channel blockers.     Past Medical History:  Diagnosis Date   Asthma    Headache    Hypertension      Family History  Problem Relation Age of Onset   Hypertension Mother    Arthritis Mother    Kidney disease Mother    Arthritis Father    Ulcers Father    Diabetes Maternal Grandmother    Hypertension Maternal Grandmother    Dementia Maternal Grandmother    Arthritis Maternal Grandfather    Arthritis Paternal Grandmother    Ovarian cancer Paternal Grandmother      Current Outpatient Medications:    amLODipine (NORVASC) 10 MG tablet, TAKE 1 TABLET(10 MG) BY MOUTH DAILY, Disp: 90 tablet, Rfl: 1   topiramate (TOPAMAX) 50 MG tablet, Take 1 tablet (50 mg total) by mouth 2 (two) times daily., Disp: 180 tablet, Rfl: 4   valsartan (DIOVAN) 160 MG tablet, TAKE 1 TABLET(160 MG) BY MOUTH DAILY, Disp: 90 tablet, Rfl: 1   meloxicam (MOBIC) 15 MG tablet, Take 15 mg by mouth daily. (Patient not taking: Reported on 06/04/2023), Disp: , Rfl:    No Known Allergies   Men's  preventive visit. Patient Health Questionnaire (PHQ-2) is  Flowsheet Row Office Visit from 06/04/2023 in [facility]  PHQ-2 Total Score 0     . Patient is on a liberal diet. Marital status: Legally Separated. Relevant history for alcohol use is:  Social History   Substance and Sexual Activity  Alcohol Use Yes   Comment: drinks on weekends  . Relevant history for tobacco use is:  Social History   Tobacco Use  Smoking Status Every Day   Current packs/day: 0.75   Average packs/day: 0.8 packs/day for 12.0 years (9.0 ttl pk-yrs)   Types: Cigarettes  Smokeless Tobacco Never  .   Review of Systems  Constitutional: Negative.   HENT: Negative.    Eyes: Negative.  Negative for blurred vision.  Respiratory: Negative.    Cardiovascular: Negative.  Negative for chest pain and palpitations.  Gastrointestinal: Negative.   Endocrine: Negative.   Genitourinary: Negative.   Musculoskeletal: Negative.   Skin: Negative.        He reports a bump on the right side above his right eye. He admits noticing this several months ago. Denies pain & drainage. He states it does sometimes swell in size and then shrink back down again. It has never fully resolved.   Allergic/Immunologic: Negative.   Neurological: Negative.  Negative for headaches.  Hematological: Negative.  Psychiatric/Behavioral: Negative.       Today's Vitals   06/04/23 1001 06/04/23 1014  BP: (!) 142/90 (!) 138/100  Pulse: 71   Temp: 98 F (36.7 C)   SpO2: 98%   Weight: 251 lb 9.6 oz (114.1 kg)   Height: 5\' 7"  (1.702 m)    Body mass index is 39.41 kg/m.  Wt Readings from Last 3 Encounters:  06/04/23 251 lb 9.6 oz (114.1 kg)  02/12/23 249 lb 12.8 oz (113.3 kg)  10/14/22 244 lb 6.4 oz (110.9 kg)   The 10-year ASCVD risk score (Arnett DK, et al., 2019) is: 13.5%*   Values used to calculate the score:     Age: 38 years     Sex: Male     Is Non-Hispanic African American: Yes     Diabetic:  No     Tobacco smoker: Yes     Systolic Blood Pressure: 138 mmHg     Is BP treated: Yes     HDL Cholesterol: 48 mg/dL*     Total Cholesterol: 173 mg/dL*     * - Cholesterol units were assumed for this score calculation   Objective:  Physical Exam Vitals and nursing note reviewed.  Constitutional:      Appearance: Normal appearance. He is obese.  HENT:     Head: Normocephalic and atraumatic.     Right Ear: Tympanic membrane, ear canal and external ear normal.     Left Ear: Tympanic membrane, ear canal and external ear normal.     Nose: Nose normal.     Mouth/Throat:     Mouth: Mucous membranes are moist.     Pharynx: Oropharynx is clear.  Eyes:     Extraocular Movements: Extraocular movements intact.     Conjunctiva/sclera: Conjunctivae normal.     Pupils: Pupils are equal, round, and reactive to light.  Cardiovascular:     Rate and Rhythm: Normal rate and regular rhythm.     Pulses: Normal pulses.     Heart sounds: Normal heart sounds.  Pulmonary:     Effort: Pulmonary effort is normal.     Breath sounds: Normal breath sounds.  Abdominal:     General: Bowel sounds are normal.     Palpations: Abdomen is soft.     Comments: Rounded, soft.   Genitourinary:    Comments: deferred Musculoskeletal:        General: Normal range of motion.     Cervical back: Normal range of motion and neck supple.  Skin:    General: Skin is warm and dry.     Comments: Scattered tattoos on UE and anteriorchest/neck  Neurological:     General: No focal deficit present.     Mental Status: He is alert and oriented to person, place, and time.  Psychiatric:        Mood and Affect: Mood normal.        Behavior: Behavior normal.         Assessment And Plan:    Annual physical exam Assessment & Plan: A full exam was performed.  DRE deferred, per patient request.  He is advised to get 30-45 minutes of regular exercise, no less than four to five days per week. Both weight-bearing and aerobic  exercises are recommended.  He is advised to follow a healthy diet with at least six fruits/veggies per day, decrease intake of red meat and other saturated fats and to increase fish intake to twice weekly.  Meats/fish should not be fried --  baked, boiled or broiled is preferable. It is also important to cut back on your sugar intake.  Be sure to read labels - try to avoid anything with added sugar, high fructose corn syrup or other sweeteners.  If you must use a sweetener, you can try stevia or monkfruit.  It is also important to avoid artificially sweetened foods/beverages and diet drinks. Lastly, wear SPF 50 sunscreen on exposed skin and when in direct sunlight for an extended period of time.  Be sure to avoid fast food restaurants and aim for at least 60 ounces of water daily.      Orders: -     CBC -     Lipid panel -     Hemoglobin A1c -     PSA -     Comprehensive metabolic panel  Hypertensive nephropathy Assessment & Plan: Chronic, uncontrolled. EKG performed, NSR w/o acute changes.  He will continue with amlodipine 10mg  daily and valsartan 160mg  daily for now. I think he may benefit from diuretic. It is important that he stay well hydrated now and especially if diuretic is added to his current regimen. Will review labs and then make further recommendations.   Orders: -     POCT urinalysis dipstick -     EKG 12-Lead -     Microalbumin / creatinine urine ratio  Stage 3a chronic kidney disease (HCC) Assessment & Plan: Chronic, he is encouraged to stay well hydrated, avoid NSAIDs and keep BP controlled to prevent progression of CKD.  I will schedule him for renal u/s. He also reports his mother had ESRD and is s/p renal transplant. I will refer him to Nephrology for further evaluation. He is in agreement with his treatment plan.   Orders: -     US RENAL; Future -     Ambulatory referral to Nephrology -     PTH, intact and calcium -     Phosphorus -     Protein electrophoresis,  serum -     Comprehensive metabolic panel  Snoring Assessment & Plan: He states he has been diagnosed with OSA in the past. He does not wish to pursue CPAP therapy. He is encouraged to avoid lying on his back and to try to sleep on his side.  Weight loss may be effective in improving his sx.    Class 2 severe obesity due to excess calories with serious comorbidity and body mass index (BMI) of 39.0 to 39.9 in adult [facility]) Assessment & Plan: He is encouraged to initially strive for BMI less than 30 to decrease cardiac risk. He is advised to exercise no less than 150 minutes per week.     He is encouraged to strive for BMI less than 30 to decrease cardiac risk. Advised to aim for at least 150 minutes of exercise per week.    Return for 1 year HM, 4 month bpc. Patient was given opportunity to ask questions. Patient verbalized understanding of the plan and was able to repeat key elements of the plan. All questions were answered to their satisfaction.    I, Gwynneth Aliment, MD, have reviewed all documentation for this visit. The documentation on 06/04/23 for the exam, diagnosis, procedures, and orders are all accurate and complete.

## 2023-06-05 LAB — PHOSPHORUS: Phosphorus: 3.9 mg/dL (ref 2.5–4.5)

## 2023-06-05 LAB — CBC
HCT: 40 % (ref 38.5–50.0)
Hemoglobin: 13.3 g/dL (ref 13.2–17.1)
MCH: 28.2 pg (ref 27.0–33.0)
MCHC: 33.3 g/dL (ref 32.0–36.0)
MCV: 84.7 fL (ref 80.0–100.0)
MPV: 12 fL (ref 7.5–12.5)
Platelets: 191 10*3/uL (ref 140–400)
RBC: 4.72 10*6/uL (ref 4.20–5.80)
RDW: 14.2 % (ref 11.0–15.0)
WBC: 3.9 10*3/uL (ref 3.8–10.8)

## 2023-06-05 LAB — COMPREHENSIVE METABOLIC PANEL
AG Ratio: 1.7 (calc) (ref 1.0–2.5)
ALT: 20 U/L (ref 9–46)
AST: 20 U/L (ref 10–40)
Albumin: 4.1 g/dL (ref 3.6–5.1)
Alkaline phosphatase (APISO): 65 U/L (ref 36–130)
BUN/Creatinine Ratio: 11 (calc) (ref 6–22)
BUN: 17 mg/dL (ref 7–25)
CO2: 27 mmol/L (ref 20–32)
Calcium: 9 mg/dL (ref 8.6–10.3)
Chloride: 107 mmol/L (ref 98–110)
Creat: 1.6 mg/dL — ABNORMAL HIGH (ref 0.60–1.29)
Globulin: 2.4 g/dL (ref 1.9–3.7)
Glucose, Bld: 95 mg/dL (ref 65–99)
Potassium: 4.1 mmol/L (ref 3.5–5.3)
Sodium: 141 mmol/L (ref 135–146)
Total Bilirubin: 0.5 mg/dL (ref 0.2–1.2)
Total Protein: 6.5 g/dL (ref 6.1–8.1)

## 2023-06-05 LAB — PTH, INTACT AND CALCIUM
Calcium: 9 mg/dL (ref 8.6–10.3)
PTH: 54 pg/mL (ref 16–77)

## 2023-06-05 LAB — HEMOGLOBIN A1C
Hgb A1c MFr Bld: 5.9 %{Hb} — ABNORMAL HIGH (ref <5.7)
Mean Plasma Glucose: 123 mg/dL
eAG (mmol/L): 6.8 mmol/L

## 2023-06-05 LAB — MICROALBUMIN / CREATININE URINE RATIO
Creatinine, Urine: 242 mg/dL (ref 20–320)
Microalb Creat Ratio: 11 mg/g{creat} (ref <30)
Microalb, Ur: 2.7 mg/dL

## 2023-06-05 LAB — LIPID PANEL
Cholesterol: 167 mg/dL (ref <200)
HDL: 46 mg/dL (ref >=40)
LDL Cholesterol (Calc): 94 mg/dL
Non-HDL Cholesterol (Calc): 121 mg/dL (ref <130)
Total CHOL/HDL Ratio: 3.6 (calc) (ref <5.0)
Triglycerides: 176 mg/dL — ABNORMAL HIGH (ref <150)

## 2023-06-05 LAB — PROTEIN / CREATININE RATIO, URINE: Albumin, U: 2.7

## 2023-06-05 LAB — PSA: PSA: 0.4 ng/mL (ref <=4.00)

## 2023-06-09 LAB — PROTEIN ELECTROPHORESIS, SERUM
A/G Ratio: 1.4 (ref 0.7–1.7)
Albumin ELP: 3.7 g/dL (ref 2.9–4.4)
Alpha 1: 0.2 g/dL (ref 0.0–0.4)
Alpha 2: 0.5 g/dL (ref 0.4–1.0)
Beta: 1 g/dL (ref 0.7–1.3)
Gamma Globulin: 1 g/dL (ref 0.4–1.8)
Globulin, Total: 2.7 g/dL (ref 2.2–3.9)
Total Protein: 6.4 g/dL (ref 6.0–8.5)

## 2023-06-10 ENCOUNTER — Ambulatory Visit
Admission: RE | Admit: 2023-06-10 | Discharge: 2023-06-10 | Disposition: A | Discharge status: Home / Self Care | Payer: BC Managed Care – PPO | Source: Ambulatory Visit | Attending: Internal Medicine | Admitting: Internal Medicine

## 2023-06-10 DIAGNOSIS — N1831 Chronic kidney disease, stage 3a: Secondary | ICD-10-CM

## 2023-07-30 ENCOUNTER — Other Ambulatory Visit: Payer: Self-pay | Admitting: Internal Medicine

## 2023-10-05 ENCOUNTER — Encounter: Payer: Self-pay | Admitting: Internal Medicine

## 2023-10-05 ENCOUNTER — Ambulatory Visit (INDEPENDENT_AMBULATORY_CARE_PROVIDER_SITE_OTHER): Payer: BC Managed Care – PPO | Admitting: Internal Medicine

## 2023-10-05 VITALS — BP 160/120 | HR 75 | Temp 98.4°F | Ht 67.0 in | Wt 259.2 lb

## 2023-10-05 DIAGNOSIS — E66813 Obesity, class 3: Secondary | ICD-10-CM

## 2023-10-05 DIAGNOSIS — Z6841 Body Mass Index (BMI) 40.0 and over, adult: Secondary | ICD-10-CM

## 2023-10-05 DIAGNOSIS — I129 Hypertensive chronic kidney disease with stage 1 through stage 4 chronic kidney disease, or unspecified chronic kidney disease: Secondary | ICD-10-CM

## 2023-10-05 DIAGNOSIS — R0683 Snoring: Secondary | ICD-10-CM

## 2023-10-05 DIAGNOSIS — R7309 Other abnormal glucose: Secondary | ICD-10-CM | POA: Insufficient documentation

## 2023-10-05 DIAGNOSIS — N1831 Chronic kidney disease, stage 3a: Secondary | ICD-10-CM | POA: Diagnosis not present

## 2023-10-05 DIAGNOSIS — F17200 Nicotine dependence, unspecified, uncomplicated: Secondary | ICD-10-CM

## 2023-10-05 NOTE — Patient Instructions (Signed)
 Hypertension, Adult Hypertension is another name for high blood pressure. High blood pressure forces your heart to work harder to pump blood. This can cause problems over time. There are two numbers in a blood pressure reading. There is a top number (systolic) over a bottom number (diastolic). It is best to have a blood pressure that is below 120/80. What are the causes? The cause of this condition is not known. Some other conditions can lead to high blood pressure. What increases the risk? Some lifestyle factors can make you more likely to develop high blood pressure: Smoking. Not getting enough exercise or physical activity. Being overweight. Having too much fat, sugar, calories, or salt (sodium) in your diet. Drinking too much alcohol. Other risk factors include: Having any of these conditions: Heart disease. Diabetes. High cholesterol. Kidney disease. Obstructive sleep apnea. Having a family history of high blood pressure and high cholesterol. Age. The risk increases with age. Stress. What are the signs or symptoms? High blood pressure may not cause symptoms. Very high blood pressure (hypertensive crisis) may cause: Headache. Fast or uneven heartbeats (palpitations). Shortness of breath. Nosebleed. Vomiting or feeling like you may vomit (nauseous). Changes in how you see. Very bad chest pain. Feeling dizzy. Seizures. How is this treated? This condition is treated by making healthy lifestyle changes, such as: Eating healthy foods. Exercising more. Drinking less alcohol. Your doctor may prescribe medicine if lifestyle changes do not help enough and if: Your top number is above 130. Your bottom number is above 80. Your personal target blood pressure may vary. Follow these instructions at home: Eating and drinking  If told, follow the DASH eating plan. To follow this plan: Fill one half of your plate at each meal with fruits and vegetables. Fill one fourth of your plate  at each meal with whole grains. Whole grains include whole-wheat pasta, brown rice, and whole-grain bread. Eat or drink low-fat dairy products, such as skim milk or low-fat yogurt. Fill one fourth of your plate at each meal with low-fat (lean) proteins. Low-fat proteins include fish, chicken without skin, eggs, beans, and tofu. Avoid fatty meat, cured and processed meat, or chicken with skin. Avoid pre-made or processed food. Limit the amount of salt in your diet to less than 1,500 mg each day. Do not drink alcohol if: Your doctor tells you not to drink. You are pregnant, may be pregnant, or are planning to become pregnant. If you drink alcohol: Limit how much you have to: 0-1 drink a day for women. 0-2 drinks a day for men. Know how much alcohol is in your drink. In the [place] one drink equals one 12 oz bottle of beer (355 mL), one 5 oz glass of wine (148 mL), or one 1 oz glass of hard liquor (44 mL). Lifestyle  Work with your doctor to stay at a healthy weight or to lose weight. Ask your doctor what the best weight is for you. Get at least 30 minutes of exercise that causes your heart to beat faster (aerobic exercise) most days of the week. This may include walking, swimming, or biking. Get at least 30 minutes of exercise that strengthens your muscles (resistance exercise) at least 3 days a week. This may include lifting weights or doing Pilates. Do not smoke or use any products that contain nicotine or tobacco. If you need help quitting, ask your doctor. Check your blood pressure at home as told by your doctor. Keep all follow-up visits. Medicines Take over-the-counter and prescription medicines  only as told by your doctor. Follow directions carefully. Do not skip doses of blood pressure medicine. The medicine does not work as well if you skip doses. Skipping doses also puts you at risk for problems. Ask your doctor about side effects or reactions to medicines that you should watch  for. Contact a doctor if: You think you are having a reaction to the medicine you are taking. You have headaches that keep coming back. You feel dizzy. You have swelling in your ankles. You have trouble with your vision. Get help right away if: You get a very bad headache. You start to feel mixed up (confused). You feel weak or numb. You feel faint. You have very bad pain in your: Chest. Belly (abdomen). You vomit more than once. You have trouble breathing. These symptoms may be an emergency. Get help right away. Call 911. Do not wait to see if the symptoms will go away. Do not drive yourself to the hospital. Summary Hypertension is another name for high blood pressure. High blood pressure forces your heart to work harder to pump blood. For most people, a normal blood pressure is less than 120/80. Making healthy choices can help lower blood pressure. If your blood pressure does not get lower with healthy choices, you may need to take medicine. This information is not intended to replace advice given to you by your health care provider. Make sure you discuss any questions you have with your health care provider. Document Revised: 06/27/2021 Document Reviewed: 06/27/2021 Elsevier Patient Education  2024 [employer].

## 2023-10-05 NOTE — Progress Notes (Signed)
 I,[name], CMA,acting as a neurosurgeon for [name], MD.,have documented all relevant documentation on the behalf of [name], MD,as directed by  [name], MD while in the presence of [name], MD.  Subjective:  Patient ID: Leonard Carter , male    DOB: Aug 15, 1976 , 48 y.o.   MRN: [id number]  Chief Complaint  Patient presents with   Hypertension    HPI  Pt presents today for bp & abnormal glucose f/u. He reports compliance with meds. He denies having any headache, chest pain & sob. He denies having any specific questions or concerns.   Hypertension This is a chronic problem. The current episode started more than 1 year ago. The problem has been gradually improving since onset. The problem is uncontrolled. Pertinent negatives include no blurred vision, chest pain, headaches or palpitations. Past treatments include calcium channel blockers.     Past Medical History:  Diagnosis Date   Asthma    Headache    Hypertension      Family History  Problem Relation Age of Onset   Hypertension Mother    Arthritis Mother    Kidney disease Mother    Arthritis Father    Ulcers Father    Diabetes Maternal Grandmother    Hypertension Maternal Grandmother    Dementia Maternal Grandmother    Arthritis Maternal Grandfather    Arthritis Paternal Grandmother    Ovarian cancer Paternal Grandmother      Current Outpatient Medications:    amLODipine  (NORVASC ) 10 MG tablet, TAKE 1 TABLET(10 MG) BY MOUTH DAILY, Disp: 90 tablet, Rfl: 1   chlorthalidone  (HYGROTON ) 25 MG tablet, Take 1 tablet (25 mg total) by mouth daily., Disp: 30 tablet, Rfl: 0   topiramate  (TOPAMAX ) 50 MG tablet, Take 1 tablet (50 mg total) by mouth 2 (two) times daily., Disp: 180 tablet, Rfl: 4   valsartan  (DIOVAN ) 160 MG tablet, TAKE 1 TABLET(160 MG) BY MOUTH DAILY, Disp: 90 tablet, Rfl: 1   meloxicam (MOBIC) 15 MG tablet, Take 15 mg by mouth daily. (Patient not taking: Reported on 10/05/2023), Disp:  , Rfl:    No Known Allergies   Review of Systems  Constitutional: Negative.   HENT: Negative.    Eyes:  Negative for blurred vision.  Respiratory: Negative.    Cardiovascular: Negative.  Negative for chest pain and palpitations.  Gastrointestinal: Negative.   Skin: Negative.   Allergic/Immunologic: Negative.   Neurological: Negative.  Negative for headaches.  Hematological: Negative.      Today's Vitals   10/05/23 1539 10/05/23 1552  BP: (!) 150/100 (!) 160/120  Pulse: 75   Temp: 98.4 F (36.9 C)   SpO2: 98%   Weight: 259 lb 3.2 oz (117.6 kg)   Height: 5' 7 (1.702 m)    Body mass index is 40.6 kg/m.  Wt Readings from Last 3 Encounters:  10/05/23 259 lb 3.2 oz (117.6 kg)  06/04/23 251 lb 9.6 oz (114.1 kg)  02/12/23 249 lb 12.8 oz (113.3 kg)    BP Readings from Last 3 Encounters:  10/05/23 (!) 160/120  06/04/23 (!) 138/100  02/12/23 (!) 140/90     Objective:  Physical Exam Vitals and nursing note reviewed.  Constitutional:      Appearance: Normal appearance.  HENT:     Head: Normocephalic and atraumatic.  Eyes:     Extraocular Movements: Extraocular movements intact.  Cardiovascular:     Rate and Rhythm: Normal rate and regular rhythm.  Heart sounds: Normal heart sounds.  Pulmonary:     Effort: Pulmonary effort is normal.     Breath sounds: Normal breath sounds.  Musculoskeletal:     Cervical back: Normal range of motion.  Skin:    General: Skin is warm.  Neurological:     General: No focal deficit present.     Mental Status: He is alert.  Psychiatric:        Mood and Affect: Mood normal.         Assessment And Plan:  Hypertensive nephropathy Assessment & Plan: Chronic, uncontrolled. He will continue with amlodipine  10mg  daily and valsartan  160mg  daily. I will add chlorthalidone  25mg  1/2 tab daily. He is encouraged to decrease intake of high sodium foods.  It is important that he stay well hydrated. He agrees to rto in 2 weeks for NV, will  check BMP at that time.   Orders: -     BMP8+EGFR; Future  Stage 3a chronic kidney disease (HCC) Assessment & Plan: Chronic, he is encouraged to stay well hydrated, avoid NSAIDs and keep BP controlled to prevent progression of CKD.  He is now followed by Nephrology.     Orders: -     BMP8+EGFR; Future  Other abnormal glucose Assessment & Plan: Previous labs reviewed, her A1c has been elevated in the past. I will check an A1c today. Reminded to avoid refined sugars including sugary drinks/foods and processed meats including bacon, sausages and deli meats.    Orders: -     Hemoglobin A1c; Future  Class 3 severe obesity due to excess calories with serious comorbidity and body mass index (BMI) of 40.0 to 44.9 in adult [facility]) Assessment & Plan: BMI 40. He has gained 8 lbs since September 2024. He is encouraged to increase his daily activity and to strive for BMI<35 to decrease cardiac risk.    Other orders -     Chlorthalidone ; Take 1 tablet (25 mg total) by mouth daily.  Dispense: 30 tablet; Refill: 0  She is encouraged to strive for BMI less than 30 to decrease cardiac risk. Advised to aim for at least 150 minutes of exercise per week.    Return in 15 days (on 10/20/2023), or NV bp check, for 4 month bpc.  Patient was given opportunity to ask questions. Patient verbalized understanding of the plan and was able to repeat key elements of the plan. All questions were answered to their satisfaction.    I, [name], MD, have reviewed all documentation for this visit. The documentation on 10/05/23 for the exam, diagnosis, procedures, and orders are all accurate and complete.    IF YOU HAVE BEEN REFERRED TO A SPECIALIST, IT MAY TAKE 1-2 WEEKS TO SCHEDULE/PROCESS THE REFERRAL. IF YOU HAVE NOT HEARD FROM US /SPECIALIST IN TWO WEEKS, PLEASE GIVE US  A CALL AT [phone].   THE PATIENT IS ENCOURAGED TO PRACTICE SOCIAL DISTANCING DUE TO THE COVID-19 PANDEMIC.

## 2023-10-06 ENCOUNTER — Encounter: Payer: Self-pay | Admitting: Internal Medicine

## 2023-10-06 MED ORDER — CHLORTHALIDONE 25 MG PO TABS: 25.0000 mg | ORAL_TABLET | Freq: Every day | ORAL | 0 refills | Status: DC

## 2023-10-11 NOTE — Assessment & Plan Note (Signed)
Previous labs reviewed, her A1c has been elevated in the past. I will check an A1c today. Reminded to avoid refined sugars including sugary drinks/foods and processed meats including bacon, sausages and deli meats.  

## 2023-10-11 NOTE — Assessment & Plan Note (Signed)
Chronic, he is encouraged to stay well hydrated, avoid NSAIDs and keep BP controlled to prevent progression of CKD.  He is now followed by Nephrology.

## 2023-10-11 NOTE — Assessment & Plan Note (Signed)
Chronic, uncontrolled. He will continue with amlodipine 10mg  daily and valsartan 160mg  daily. I will add chlorthalidone 25mg  1/2 tab daily. He is encouraged to decrease intake of high sodium foods.  It is important that he stay well hydrated. He agrees to rto in 2 weeks for NV, will check BMP at that time.

## 2023-10-11 NOTE — Assessment & Plan Note (Signed)
BMI 40. He has gained 8 lbs since September 2024. He is encouraged to increase his daily activity and to strive for BMI<35 to decrease cardiac risk.

## 2023-10-20 ENCOUNTER — Ambulatory Visit: Payer: BC Managed Care – PPO

## 2023-10-20 VITALS — BP 130/70 | HR 86 | Temp 98.4°F | Ht 67.0 in | Wt 259.0 lb

## 2023-10-20 DIAGNOSIS — I10 Essential (primary) hypertension: Secondary | ICD-10-CM

## 2023-10-20 DIAGNOSIS — N1831 Chronic kidney disease, stage 3a: Secondary | ICD-10-CM

## 2023-10-20 DIAGNOSIS — R7309 Other abnormal glucose: Secondary | ICD-10-CM

## 2023-10-20 DIAGNOSIS — I129 Hypertensive chronic kidney disease with stage 1 through stage 4 chronic kidney disease, or unspecified chronic kidney disease: Secondary | ICD-10-CM

## 2023-10-20 NOTE — Progress Notes (Signed)
Patient presents today for a bpc. Patient reports compliance with his meds. Patient reports he takes valsartan 160mg  and chlorthalidone 25mg  in the mornings and amlodipine 10mg  in the evenings. I checked patient's bp and it was 130/70 P86. He reports he has been severely constipated since starting the chlorthalidone. After speaking with provider she stated his blood pressure looks good. Pt should continue with current meds. He is to keep his next appointment as scheduled.     BP Readings from Last 3 Encounters:  10/05/23 (!) 160/120  06/04/23 (!) 138/100  02/12/23 (!) 140/90

## 2023-10-20 NOTE — Patient Instructions (Signed)
Hypertension, Adult Hypertension is another name for high blood pressure. High blood pressure forces your heart to work harder to pump blood. This can cause problems over time. There are two numbers in a blood pressure reading. There is a top number (systolic) over a bottom number (diastolic). It is best to have a blood pressure that is below 120/80. What are the causes? The cause of this condition is not known. Some other conditions can lead to high blood pressure. What increases the risk? Some lifestyle factors can make you more likely to develop high blood pressure: Smoking. Not getting enough exercise or physical activity. Being overweight. Having too much fat, sugar, calories, or salt (sodium) in your diet. Drinking too much alcohol. Other risk factors include: Having any of these conditions: Heart disease. Diabetes. High cholesterol. Kidney disease. Obstructive sleep apnea. Having a family history of high blood pressure and high cholesterol. Age. The risk increases with age. Stress. What are the signs or symptoms? High blood pressure may not cause symptoms. Very high blood pressure (hypertensive crisis) may cause: Headache. Fast or uneven heartbeats (palpitations). Shortness of breath. Nosebleed. Vomiting or feeling like you may vomit (nauseous). Changes in how you see. Very bad chest pain. Feeling dizzy. Seizures. How is this treated? This condition is treated by making healthy lifestyle changes, such as: Eating healthy foods. Exercising more. Drinking less alcohol. Your doctor may prescribe medicine if lifestyle changes do not help enough and if: Your top number is above 130. Your bottom number is above 80. Your personal target blood pressure may vary. Follow these instructions at home: Eating and drinking  If told, follow the DASH eating plan. To follow this plan: Fill one half of your plate at each meal with fruits and vegetables. Fill one fourth of your plate  at each meal with whole grains. Whole grains include whole-wheat pasta, brown rice, and whole-grain bread. Eat or drink low-fat dairy products, such as skim milk or low-fat yogurt. Fill one fourth of your plate at each meal with low-fat (lean) proteins. Low-fat proteins include fish, chicken without skin, eggs, beans, and tofu. Avoid fatty meat, cured and processed meat, or chicken with skin. Avoid pre-made or processed food. Limit the amount of salt in your diet to less than 1,500 mg each day. Do not drink alcohol if: Your doctor tells you not to drink. You are pregnant, may be pregnant, or are planning to become pregnant. If you drink alcohol: Limit how much you have to: 0-1 drink a day for women. 0-2 drinks a day for men. Know how much alcohol is in your drink. In the [place] one drink equals one 12 oz bottle of beer (355 mL), one 5 oz glass of wine (148 mL), or one 1 oz glass of hard liquor (44 mL). Lifestyle  Work with your doctor to stay at a healthy weight or to lose weight. Ask your doctor what the best weight is for you. Get at least 30 minutes of exercise that causes your heart to beat faster (aerobic exercise) most days of the week. This may include walking, swimming, or biking. Get at least 30 minutes of exercise that strengthens your muscles (resistance exercise) at least 3 days a week. This may include lifting weights or doing Pilates. Do not smoke or use any products that contain nicotine or tobacco. If you need help quitting, ask your doctor. Check your blood pressure at home as told by your doctor. Keep all follow-up visits. Medicines Take over-the-counter and prescription medicines  only as told by your doctor. Follow directions carefully. Do not skip doses of blood pressure medicine. The medicine does not work as well if you skip doses. Skipping doses also puts you at risk for problems. Ask your doctor about side effects or reactions to medicines that you should watch  for. Contact a doctor if: You think you are having a reaction to the medicine you are taking. You have headaches that keep coming back. You feel dizzy. You have swelling in your ankles. You have trouble with your vision. Get help right away if: You get a very bad headache. You start to feel mixed up (confused). You feel weak or numb. You feel faint. You have very bad pain in your: Chest. Belly (abdomen). You vomit more than once. You have trouble breathing. These symptoms may be an emergency. Get help right away. Call 911. Do not wait to see if the symptoms will go away. Do not drive yourself to the hospital. Summary Hypertension is another name for high blood pressure. High blood pressure forces your heart to work harder to pump blood. For most people, a normal blood pressure is less than 120/80. Making healthy choices can help lower blood pressure. If your blood pressure does not get lower with healthy choices, you may need to take medicine. This information is not intended to replace advice given to you by your health care provider. Make sure you discuss any questions you have with your health care provider. Document Revised: 06/27/2021 Document Reviewed: 06/27/2021 Elsevier Patient Education  2024 [employer].

## 2023-10-21 LAB — BMP8+EGFR
BUN/Creatinine Ratio: 19 (ref 9–20)
BUN: 32 mg/dL — ABNORMAL HIGH (ref 6–24)
CO2: 24 mmol/L (ref 20–29)
Calcium: 9.3 mg/dL (ref 8.7–10.2)
Chloride: 102 mmol/L (ref 96–106)
Creatinine, Ser: 1.68 mg/dL — ABNORMAL HIGH (ref 0.76–1.27)
Glucose: 101 mg/dL — ABNORMAL HIGH (ref 70–99)
Potassium: 3.6 mmol/L (ref 3.5–5.2)
Sodium: 141 mmol/L (ref 134–144)
eGFR: 50 mL/min/{1.73_m2} — ABNORMAL LOW (ref >59)

## 2023-10-21 LAB — HEMOGLOBIN A1C
Est. average glucose Bld gHb Est-mCnc: 134 mg/dL
Hgb A1c MFr Bld: 6.3 % — ABNORMAL HIGH (ref 4.8–5.6)

## 2023-11-04 ENCOUNTER — Other Ambulatory Visit: Payer: Self-pay | Admitting: Internal Medicine

## 2023-11-04 ENCOUNTER — Encounter: Payer: Self-pay | Admitting: Internal Medicine

## 2023-11-04 MED ORDER — CHLORTHALIDONE 25 MG PO TABS: 25.0000 mg | ORAL_TABLET | Freq: Every day | ORAL | 1 refills | Status: DC

## 2023-11-16 ENCOUNTER — Ambulatory Visit: Payer: BC Managed Care – PPO | Admitting: Nurse Practitioner

## 2023-11-16 ENCOUNTER — Encounter: Payer: Self-pay | Admitting: Nurse Practitioner

## 2023-11-16 VITALS — BP 120/78 | HR 76 | Temp 98.1°F | Ht 67.0 in | Wt 252.8 lb

## 2023-11-16 DIAGNOSIS — E66812 Obesity, class 2: Secondary | ICD-10-CM

## 2023-11-16 DIAGNOSIS — J4521 Mild intermittent asthma with (acute) exacerbation: Secondary | ICD-10-CM

## 2023-11-16 DIAGNOSIS — Z2821 Immunization not carried out because of patient refusal: Secondary | ICD-10-CM

## 2023-11-16 DIAGNOSIS — R0981 Nasal congestion: Secondary | ICD-10-CM | POA: Diagnosis not present

## 2023-11-16 DIAGNOSIS — Z6839 Body mass index (BMI) 39.0-39.9, adult: Secondary | ICD-10-CM

## 2023-11-16 MED ORDER — AIRSUPRA 90-80 MCG/ACT IN AERO: 2.0000 | INHALATION_SPRAY | Freq: Four times a day (QID) | RESPIRATORY_TRACT | Status: DC | PRN

## 2023-11-16 MED ORDER — PREDNISONE 10 MG (21) PO TBPK: ORAL_TABLET | ORAL | 0 refills | Status: DC

## 2023-11-16 MED ORDER — AZITHROMYCIN 250 MG PO TABS: ORAL_TABLET | ORAL | 0 refills | Status: AC

## 2023-11-16 MED ORDER — AIRSUPRA 90-80 MCG/ACT IN AERO: 2.0000 | INHALATION_SPRAY | Freq: Four times a day (QID) | RESPIRATORY_TRACT | 1 refills | Status: AC | PRN

## 2023-11-16 NOTE — Progress Notes (Unsigned)
 Madelaine Bhat, CMA,acting as a Neurosurgeon for Arnette Felts, FNP.,have documented all relevant documentation on the behalf of Arnette Felts, FNP,as directed by  Arnette Felts, FNP while in the presence of Arnette Felts, FNP.  Subjective:  Patient ID: Leonard Carter , male    DOB: 07-Jun-1976 , 48 y.o.   MRN: [id number]  Chief Complaint  Patient presents with   Cough    HPI  Patient presents today for cough, phlegm and wheezing. He did do a home Covid test which was negative. Patient reports he has had a cough for 4 weeks and he has been taking coricidin. He tried "father Jonny Ruiz" and mucinex. Patient reports he is wheezing. Patient reports his phlegm was green but now it is sometimes clear and sometimes green.  Patient reports the cough is keeping him up all night and last Thursday and Friday he had a really bad headache.   He reports a history of asthma and about 6 months ago had an asthma attack, was having difficulty with breathing and used his daughters albuterol nebulizer.      Past Medical History:  Diagnosis Date   Asthma    Headache    Hypertension      Family History  Problem Relation Age of Onset   Hypertension Mother    Arthritis Mother    Kidney disease Mother    Arthritis Father    Ulcers Father    Diabetes Maternal Grandmother    Hypertension Maternal Grandmother    Dementia Maternal Grandmother    Arthritis Maternal Grandfather    Arthritis Paternal Grandmother    Ovarian cancer Paternal Grandmother      Current Outpatient Medications:    amLODipine (NORVASC) 10 MG tablet, TAKE 1 TABLET(10 MG) BY MOUTH DAILY, Disp: 90 tablet, Rfl: 1   azithromycin (ZITHROMAX) 250 MG tablet, Take 2 tablets (500 mg) on  Day 1,  followed by 1 tablet (250 mg) once daily on Days 2 through 5., Disp: 6 each, Rfl: 0   chlorthalidone (HYGROTON) 25 MG tablet, Take 1 tablet (25 mg total) by mouth daily., Disp: 90 tablet, Rfl: 1   predniSONE (STERAPRED UNI-PAK 21 TAB) 10 MG (21) TBPK tablet, Take  as directed, Disp: 21 tablet, Rfl: 0   topiramate (TOPAMAX) 50 MG tablet, Take 1 tablet (50 mg total) by mouth 2 (two) times daily., Disp: 180 tablet, Rfl: 4   valsartan (DIOVAN) 160 MG tablet, TAKE 1 TABLET(160 MG) BY MOUTH DAILY, Disp: 90 tablet, Rfl: 1   Albuterol-Budesonide (AIRSUPRA) 90-80 MCG/ACT AERO, Inhale 2 puffs into the lungs every 6 (six) hours as needed., Disp: 32.1 g, Rfl: 1   meloxicam (MOBIC) 15 MG tablet, Take 15 mg by mouth daily. (Patient not taking: Reported on 06/04/2023), Disp: , Rfl:    No Known Allergies   Review of Systems   Today's Vitals   11/16/23 1011  BP: 120/78  Pulse: 76  Temp: 98.1 F (36.7 C)  TempSrc: Oral  Weight: 252 lb 12.8 oz (114.7 kg)  Height: 5\' 7"  (1.702 m)  PainSc: 0-No pain   Body mass index is 39.59 kg/m.  Wt Readings from Last 3 Encounters:  11/16/23 252 lb 12.8 oz (114.7 kg)  10/20/23 259 lb (117.5 kg)  10/05/23 259 lb 3.2 oz (117.6 kg)     Objective:  Physical Exam Vitals reviewed.  Constitutional:      General: He is not in acute distress.    Appearance: Normal appearance. He is obese.  HENT:  Head: Normocephalic.  Cardiovascular:     Rate and Rhythm: Normal rate and regular rhythm.     Pulses: Normal pulses.     Heart sounds: Normal heart sounds. No murmur heard. Pulmonary:     Effort: Pulmonary effort is normal. No respiratory distress.     Breath sounds: Normal breath sounds. No wheezing.  Musculoskeletal:        General: Normal range of motion.  Skin:    General: Skin is warm and dry.     Capillary Refill: Capillary refill takes less than 2 seconds.  Neurological:     General: No focal deficit present.     Mental Status: He is alert and oriented to person, place, and time.  Psychiatric:        Mood and Affect: Mood normal.        Behavior: Behavior normal.        Thought Content: Thought content normal.        Judgment: Judgment normal.         Assessment And Plan:  Mild intermittent asthma with  acute exacerbation -     predniSONE; Take as directed  Dispense: 21 tablet; Refill: 0 -     Azithromycin; Take 2 tablets (500 mg) on  Day 1,  followed by 1 tablet (250 mg) once daily on Days 2 through 5.  Dispense: 6 each; Refill: 0 -     Airsupra; Inhale 2 puffs into the lungs every 6 (six) hours as needed.  Dispense: 32.1 g; Refill: 1  Nasal congestion  COVID-19 vaccination declined  Class 2 severe obesity due to excess calories with serious comorbidity and body mass index (BMI) of 39.0 to 39.9 in adult [facility])    Return for keep same next.  Patient was given opportunity to ask questions. Patient verbalized understanding of the plan and was able to repeat key elements of the plan. All questions were answered to their satisfaction.    Jeanell Sparrow, FNP, have reviewed all documentation for this visit. The documentation on 11/16/23 for the exam, diagnosis, procedures, and orders are all accurate and complete.   IF YOU HAVE BEEN REFERRED TO A SPECIALIST, IT MAY TAKE 1-2 WEEKS TO SCHEDULE/PROCESS THE REFERRAL. IF YOU HAVE NOT HEARD FROM US/SPECIALIST IN TWO WEEKS, PLEASE GIVE [place] A CALL AT [phone].

## 2023-11-20 ENCOUNTER — Encounter: Payer: Self-pay | Admitting: Nurse Practitioner

## 2023-11-20 DIAGNOSIS — R0981 Nasal congestion: Secondary | ICD-10-CM | POA: Insufficient documentation

## 2023-11-20 DIAGNOSIS — Z2821 Immunization not carried out because of patient refusal: Secondary | ICD-10-CM | POA: Insufficient documentation

## 2023-11-20 DIAGNOSIS — E66812 Obesity, class 2: Secondary | ICD-10-CM | POA: Insufficient documentation

## 2023-11-20 DIAGNOSIS — J4521 Mild intermittent asthma with (acute) exacerbation: Secondary | ICD-10-CM | POA: Insufficient documentation

## 2023-11-20 NOTE — Assessment & Plan Note (Signed)
 No wheezing on exam however reported wheezing and does have dry cough, sample of airsupra given and will treat with steroids return call if not improved.

## 2023-11-20 NOTE — Assessment & Plan Note (Signed)
 He is encouraged to strive for BMI less than 30 to decrease cardiac risk. Advised to aim for at least 150 minutes of exercise per week.

## 2023-11-20 NOTE — Assessment & Plan Note (Signed)

## 2023-11-20 NOTE — Assessment & Plan Note (Signed)
 Encouraged to use flonase nasal spray

## 2024-01-27 ENCOUNTER — Other Ambulatory Visit: Payer: Self-pay | Admitting: Internal Medicine

## 2024-01-29 LAB — LAB REPORT - SCANNED
Creatinine, POC: 214.3 mg/dL
EGFR: 49

## 2024-02-02 ENCOUNTER — Encounter: Payer: Self-pay | Admitting: Internal Medicine

## 2024-02-02 ENCOUNTER — Ambulatory Visit: Payer: Self-pay | Admitting: Internal Medicine

## 2024-02-02 VITALS — BP 114/74 | HR 87 | Temp 98.7°F | Ht 67.0 in | Wt 265.2 lb

## 2024-02-02 DIAGNOSIS — M170 Bilateral primary osteoarthritis of knee: Secondary | ICD-10-CM | POA: Diagnosis not present

## 2024-02-02 DIAGNOSIS — R7309 Other abnormal glucose: Secondary | ICD-10-CM

## 2024-02-02 DIAGNOSIS — N1831 Chronic kidney disease, stage 3a: Secondary | ICD-10-CM | POA: Diagnosis not present

## 2024-02-02 DIAGNOSIS — I129 Hypertensive chronic kidney disease with stage 1 through stage 4 chronic kidney disease, or unspecified chronic kidney disease: Secondary | ICD-10-CM | POA: Diagnosis not present

## 2024-02-02 DIAGNOSIS — Z720 Tobacco use: Secondary | ICD-10-CM

## 2024-02-02 DIAGNOSIS — I10 Essential (primary) hypertension: Secondary | ICD-10-CM

## 2024-02-02 DIAGNOSIS — Z6841 Body Mass Index (BMI) 40.0 and over, adult: Secondary | ICD-10-CM

## 2024-02-02 DIAGNOSIS — E66813 Obesity, class 3: Secondary | ICD-10-CM

## 2024-02-02 MED ORDER — TRAMADOL HCL 50 MG PO TABS: 50.0000 mg | ORAL_TABLET | Freq: Four times a day (QID) | ORAL | 0 refills | Status: AC | PRN

## 2024-02-02 NOTE — Patient Instructions (Signed)
 Hypertension, Adult Hypertension is another name for high blood pressure. High blood pressure forces your heart to work harder to pump blood. This can cause problems over time. There are two numbers in a blood pressure reading. There is a top number (systolic) over a bottom number (diastolic). It is best to have a blood pressure that is below 120/80. What are the causes? The cause of this condition is not known. Some other conditions can lead to high blood pressure. What increases the risk? Some lifestyle factors can make you more likely to develop high blood pressure: Smoking. Not getting enough exercise or physical activity. Being overweight. Having too much fat, sugar, calories, or salt (sodium) in your diet. Drinking too much alcohol. Other risk factors include: Having any of these conditions: Heart disease. Diabetes. High cholesterol. Kidney disease. Obstructive sleep apnea. Having a family history of high blood pressure and high cholesterol. Age. The risk increases with age. Stress. What are the signs or symptoms? High blood pressure may not cause symptoms. Very high blood pressure (hypertensive crisis) may cause: Headache. Fast or uneven heartbeats (palpitations). Shortness of breath. Nosebleed. Vomiting or feeling like you may vomit (nauseous). Changes in how you see. Very bad chest pain. Feeling dizzy. Seizures. How is this treated? This condition is treated by making healthy lifestyle changes, such as: Eating healthy foods. Exercising more. Drinking less alcohol. Your doctor may prescribe medicine if lifestyle changes do not help enough and if: Your top number is above 130. Your bottom number is above 80. Your personal target blood pressure may vary. Follow these instructions at home: Eating and drinking  If told, follow the DASH eating plan. To follow this plan: Fill one half of your plate at each meal with fruits and vegetables. Fill one fourth of your plate  at each meal with whole grains. Whole grains include whole-wheat pasta, brown rice, and whole-grain bread. Eat or drink low-fat dairy products, such as skim milk or low-fat yogurt. Fill one fourth of your plate at each meal with low-fat (lean) proteins. Low-fat proteins include fish, chicken without skin, eggs, beans, and tofu. Avoid fatty meat, cured and processed meat, or chicken with skin. Avoid pre-made or processed food. Limit the amount of salt in your diet to less than 1,500 mg each day. Do not drink alcohol if: Your doctor tells you not to drink. You are pregnant, may be pregnant, or are planning to become pregnant. If you drink alcohol: Limit how much you have to: 0-1 drink a day for women. 0-2 drinks a day for men. Know how much alcohol is in your drink. In the [place] one drink equals one 12 oz bottle of beer (355 mL), one 5 oz glass of wine (148 mL), or one 1 oz glass of hard liquor (44 mL). Lifestyle  Work with your doctor to stay at a healthy weight or to lose weight. Ask your doctor what the best weight is for you. Get at least 30 minutes of exercise that causes your heart to beat faster (aerobic exercise) most days of the week. This may include walking, swimming, or biking. Get at least 30 minutes of exercise that strengthens your muscles (resistance exercise) at least 3 days a week. This may include lifting weights or doing Pilates. Do not smoke or use any products that contain nicotine or tobacco. If you need help quitting, ask your doctor. Check your blood pressure at home as told by your doctor. Keep all follow-up visits. Medicines Take over-the-counter and prescription medicines  only as told by your doctor. Follow directions carefully. Do not skip doses of blood pressure medicine. The medicine does not work as well if you skip doses. Skipping doses also puts you at risk for problems. Ask your doctor about side effects or reactions to medicines that you should watch  for. Contact a doctor if: You think you are having a reaction to the medicine you are taking. You have headaches that keep coming back. You feel dizzy. You have swelling in your ankles. You have trouble with your vision. Get help right away if: You get a very bad headache. You start to feel mixed up (confused). You feel weak or numb. You feel faint. You have very bad pain in your: Chest. Belly (abdomen). You vomit more than once. You have trouble breathing. These symptoms may be an emergency. Get help right away. Call 911. Do not wait to see if the symptoms will go away. Do not drive yourself to the hospital. Summary Hypertension is another name for high blood pressure. High blood pressure forces your heart to work harder to pump blood. For most people, a normal blood pressure is less than 120/80. Making healthy choices can help lower blood pressure. If your blood pressure does not get lower with healthy choices, you may need to take medicine. This information is not intended to replace advice given to you by your health care provider. Make sure you discuss any questions you have with your health care provider. Document Revised: 06/27/2021 Document Reviewed: 06/27/2021 Elsevier Patient Education  2024 [employer].

## 2024-02-02 NOTE — Progress Notes (Signed)
 I,Jameka J Llittleton, CMA,acting as a Neurosurgeon for Smiley Dung, MD.,have documented all relevant documentation on the behalf of Smiley Dung, MD,as directed by  Smiley Dung, MD while in the presence of Smiley Dung, MD.  Subjective:  Patient ID: Leonard Carter , male    DOB: April 21, 1976 , 48 y.o.   MRN: [id number]  Chief Complaint  Patient presents with   Hypertension    Pt presents today for bp & abnormal glucose f/u. He reports compliance with meds. He denies having any headache, chest pain & sob. Having really bad trouble with both knees mainly his right knee,     HPI Discussed the use of AI scribe software for clinical note transcription with the patient, who gave verbal consent to proceed.  History of Present Illness Leonard Carter is a 48 year old male with hypertension and osteoarthritis who presents for a blood pressure check and further evaluation of worsening knee pain.  His blood pressure has been stable on valsartan  160 mg, chlorthalidone , and amlodipine . Recently, his nephrologist increased his chlorthalidone  dose to two tablets due to leg swelling, but he has not noticed any improvement in the swelling since the increase, which was about four to five days ago. He does not use salt in his diet.  He experiences worsening pain in his knees, which has become more frequent and severe, waking him up at night. His job requires prolonged standing, which may contribute to the pain. He has a history of osteoarthritis in both knees, with the right knee being more painful. A previous cortisone shot was ineffective, and meloxicam helped with the pain but was discontinued due to kidney concerns. He uses Voltaren gel and Biofreeze for pain relief but has not tried lidocaine patches. He takes up to six Tylenol  a day on bad days, experiencing more bad days than good, with the last two weeks being particularly difficult. His knee pain worsens with rain.  He has a history of rickets as a child,  which his parents were told could lead to early arthritis. He smokes about one pack of cigarettes a week, which he acknowledges could contribute to inflammation. He is concerned about needing a note for work to accommodate his knee pain and frequent bathroom breaks due to his blood pressure medication.   Hypertension This is a chronic problem. The current episode started more than 1 year ago. The problem has been gradually improving since onset. The problem is uncontrolled. Pertinent negatives include no blurred vision, chest pain, headaches or palpitations. Past treatments include calcium channel blockers.     Past Medical History:  Diagnosis Date   Asthma    Class 3 severe obesity due to excess calories with serious comorbidity and body mass index (BMI) of 40.0 to 44.9 in adult 08/05/2016   Headache    Hypertension      Family History  Problem Relation Age of Onset   Hypertension Mother    Arthritis Mother    Kidney disease Mother    Arthritis Father    Ulcers Father    Diabetes Maternal Grandmother    Hypertension Maternal Grandmother    Dementia Maternal Grandmother    Arthritis Maternal Grandfather    Arthritis Paternal Grandmother    Ovarian cancer Paternal Grandmother      Current Outpatient Medications:    Albuterol-Budesonide (AIRSUPRA ) 90-80 MCG/ACT AERO, Inhale 2 puffs into the lungs every 6 (six) hours as needed., Disp: 32.1 g, Rfl: 1   amLODipine  (NORVASC ) 10 MG  tablet, TAKE 1 TABLET(10 MG) BY MOUTH DAILY, Disp: 90 tablet, Rfl: 1   chlorthalidone  (HYGROTON ) 25 MG tablet, Take 1 tablet (25 mg total) by mouth daily. (Patient taking differently: Take 50 mg by mouth daily.), Disp: 90 tablet, Rfl: 1   traMADol  (ULTRAM ) 50 MG tablet, Take 1 tablet (50 mg total) by mouth every 6 (six) hours as needed., Disp: 20 tablet, Rfl: 0   valsartan  (DIOVAN ) 160 MG tablet, TAKE 1 TABLET(160 MG) BY MOUTH DAILY, Disp: 90 tablet, Rfl: 1   No Known Allergies   Review of Systems   Constitutional: Negative.   Eyes:  Negative for blurred vision.  Respiratory: Negative.    Cardiovascular: Negative.  Negative for chest pain and palpitations.  Gastrointestinal: Negative.   Musculoskeletal:  Positive for arthralgias.  Neurological: Negative.  Negative for headaches.  Psychiatric/Behavioral: Negative.       Today's Vitals   02/02/24 1546  BP: 114/74  Pulse: 87  Temp: 98.7 F (37.1 C)  TempSrc: Oral  Weight: 265 lb 3.2 oz (120.3 kg)  Height: 5\' 7"  (1.702 m)  PainSc: 7   PainLoc: Knee   Body mass index is 41.54 kg/m.  Wt Readings from Last 3 Encounters:  02/02/24 265 lb 3.2 oz (120.3 kg)  11/16/23 252 lb 12.8 oz (114.7 kg)  10/20/23 259 lb (117.5 kg)    The 10-year ASCVD risk score (Arnett DK, et al., 2019) is: 10.1%   Values used to calculate the score:     Age: 55 years     Sex: Male     Is Non-Hispanic African American: Yes     Diabetic: No     Tobacco smoker: Yes     Systolic Blood Pressure: 114 mmHg     Is BP treated: Yes     HDL Cholesterol: 46 mg/dL     Total Cholesterol: 167 mg/dL  Objective:  Physical Exam Vitals and nursing note reviewed.  Constitutional:      Appearance: Normal appearance. He is obese.  HENT:     Head: Normocephalic and atraumatic.  Eyes:     Extraocular Movements: Extraocular movements intact.  Cardiovascular:     Rate and Rhythm: Normal rate and regular rhythm.     Heart sounds: Normal heart sounds.  Pulmonary:     Effort: Pulmonary effort is normal.     Breath sounds: Normal breath sounds.  Musculoskeletal:     Cervical back: Normal range of motion.     Comments: Neg crepitus b/l  Skin:    General: Skin is warm.  Neurological:     General: No focal deficit present.     Mental Status: He is alert.  Psychiatric:        Mood and Affect: Mood normal.       Assessment And Plan:  Hypertensive nephropathy Assessment & Plan: Hypertension controlled with valsartan , chlorthalidone , and amlodipine . Recent  chlorthalidone  increase due to leg swelling. Blood pressure elevated at nephrology visit. No improvement in swelling. - Continue valsartan , chlorthalidone , and amlodipine . - Monitor blood pressure regularly. - Discuss 90-day medication supply with pharmacy.   Stage 3a chronic kidney disease (HCC) Assessment & Plan: Chronic, he is encouraged to stay well hydrated, avoid NSAIDs and keep BP controlled to prevent progression of CKD.  He is now followed by Nephrology.      Primary osteoarthritis of both knees Assessment & Plan: Chronic osteoarthritis with severe right knee pain. Cortisone injections ineffective. Pain impacts daily activities and sleep. Tramadol  considered for severe pain.  Gabapentin potential for chronic pain. Knee replacement not advised due to age. - PDMP reviewed, prescribe tramadol  for severe pain, with acetaminophen  as needed. - Instruct to try tramadol  initially at night or on a weekend to assess tolerance. - Consider gabapentin if tramadol  insufficient. - Refer to pain clinic if pain management inadequate. - Encourage follow-up with orthopedic specialist.   Other abnormal glucose Assessment & Plan: Previous labs reviewed, his A1c has been elevated in the past. I will check an A1c today. Reminded to avoid refined sugars including sugary drinks/foods and processed meats including bacon, sausages and deli meats.    Orders: -     Hemoglobin A1c  Class 3 severe obesity due to excess calories with serious comorbidity and body mass index (BMI) of 40.0 to 44.9 in adult Assessment & Plan: He has gained 13 lbs since Feb 2025. He is encouraged to aim for at least 150 minutes of exercise per week.    Tobacco use Assessment & Plan: Smoking one pack per week, increased due to stress. Cessation encouraged to reduce inflammation and improve health. - Encourage smoking cessation to reduce inflammation and improve knee pain. - Discuss potential smoking cessation aids or  programs if interested. - Smoking cessation instruction/counseling given:  counseled patient on the dangers of tobacco use, advised patient to stop smoking, and reviewed strategies to maximize success.    Other orders -     traMADol  HCl; Take 1 tablet (50 mg total) by mouth every 6 (six) hours as needed.  Dispense: 20 tablet; Refill: 0   Return if symptoms worsen or fail to improve, for bpc.  Patient was given opportunity to ask questions. Patient verbalized understanding of the plan and was able to repeat key elements of the plan. All questions were answered to their satisfaction.   I, Smiley Dung, MD, have reviewed all documentation for this visit. The documentation on 02/02/24 for the exam, diagnosis, procedures, and orders are all accurate and complete.   IF YOU HAVE BEEN REFERRED TO A SPECIALIST, IT MAY TAKE 1-2 WEEKS TO SCHEDULE/PROCESS THE REFERRAL. IF YOU HAVE NOT HEARD FROM US /SPECIALIST IN TWO WEEKS, PLEASE GIVE US  A CALL AT [phone].

## 2024-02-03 LAB — HEMOGLOBIN A1C
Hgb A1c MFr Bld: 6.5 % — ABNORMAL HIGH (ref <5.7)
Mean Plasma Glucose: 140 mg/dL
eAG (mmol/L): 7.7 mmol/L

## 2024-02-04 ENCOUNTER — Ambulatory Visit: Payer: Self-pay | Admitting: Internal Medicine

## 2024-02-06 DIAGNOSIS — Z720 Tobacco use: Secondary | ICD-10-CM | POA: Insufficient documentation

## 2024-02-06 DIAGNOSIS — M170 Bilateral primary osteoarthritis of knee: Secondary | ICD-10-CM | POA: Insufficient documentation

## 2024-02-06 NOTE — Assessment & Plan Note (Signed)
 Hypertension controlled with valsartan , chlorthalidone , and amlodipine . Recent chlorthalidone  increase due to leg swelling. Blood pressure elevated at nephrology visit. No improvement in swelling. - Continue valsartan , chlorthalidone , and amlodipine . - Monitor blood pressure regularly. - Discuss 90-day medication supply with pharmacy.

## 2024-02-06 NOTE — Assessment & Plan Note (Signed)
 Smoking one pack per week, increased due to stress. Cessation encouraged to reduce inflammation and improve health. - Encourage smoking cessation to reduce inflammation and improve knee pain. - Discuss potential smoking cessation aids or programs if interested. - Smoking cessation instruction/counseling given:  counseled patient on the dangers of tobacco use, advised patient to stop smoking, and reviewed strategies to maximize success.

## 2024-02-06 NOTE — Assessment & Plan Note (Signed)
 Chronic, he is encouraged to stay well hydrated, avoid NSAIDs and keep BP controlled to prevent progression of CKD.  He is now followed by Nephrology.

## 2024-02-06 NOTE — Assessment & Plan Note (Addendum)
 Chronic osteoarthritis with severe right knee pain. Cortisone injections ineffective. Pain impacts daily activities and sleep. Tramadol  considered for severe pain. Gabapentin potential for chronic pain. Knee replacement not advised due to age. - PDMP reviewed, prescribe tramadol  for severe pain, with acetaminophen  as needed. - Instruct to try tramadol  initially at night or on a weekend to assess tolerance. - Consider gabapentin if tramadol  insufficient. - Refer to pain clinic if pain management inadequate. - Encourage follow-up with orthopedic specialist.

## 2024-02-06 NOTE — Assessment & Plan Note (Signed)
 Previous labs reviewed, his A1c has been elevated in the past. I will check an A1c today. Reminded to avoid refined sugars including sugary drinks/foods and processed meats including bacon, sausages and deli meats.

## 2024-02-06 NOTE — Assessment & Plan Note (Signed)
 He has gained 13 lbs since Feb 2025. He is encouraged to aim for at least 150 minutes of exercise per week.

## 2024-02-09 ENCOUNTER — Encounter: Payer: Self-pay | Admitting: Internal Medicine

## 2024-02-09 ENCOUNTER — Ambulatory Visit: Payer: Self-pay

## 2024-02-09 VITALS — BP 114/74 | HR 87 | Temp 98.5°F | Ht 67.0 in | Wt 265.0 lb

## 2024-02-09 DIAGNOSIS — E119 Type 2 diabetes mellitus without complications: Secondary | ICD-10-CM

## 2024-02-09 NOTE — Patient Instructions (Signed)

## 2024-02-09 NOTE — Progress Notes (Signed)
 Patient presented today for a [place] ozempic teaching. Patient administered his first dose today. YL,RMA

## 2024-02-29 ENCOUNTER — Ambulatory Visit: Payer: Self-pay

## 2024-02-29 NOTE — Telephone Encounter (Signed)
 FYI Only or Action Required?: FYI only for provider  Patient was last seen in primary care on 02/02/2024 by Cleave Curling, MD. Called Nurse Triage reporting Dizziness. Symptoms began yesterday. Interventions attempted: Nothing. Symptoms are: stable.  Triage Disposition: Go to ED or PCP/Alternative with Approval  Patient/caregiver understands and will follow disposition?: Yes        Copied from CRM [id number]. Topic: Clinical - Red Word Triage >> Feb 29, 2024 10:12 AM [employer] wrote: Red Word that prompted transfer to Nurse Triage: Low Blood Pressure, light headed. Last night Reading: 104/70 Morning: 95/68 Reason for Disposition  [1] Fall in systolic BP > 20 mm Hg from normal AND [2] dizzy, lightheaded, or weak  Answer Assessment - Initial Assessment Questions 1. BLOOD PRESSURE: "What is the blood pressure?" "Did you take at least two measurements 5 minutes apart?"   ---------- last night 104/70  ---------- This am: 95/58 ( at work 830 am) ---- Unable to take again during the call  ---- Baseline BP: 114~/98 ~   2. ONSET: "When did you take your blood pressure?"     ----------------- Yesterday    3. HOW: "How did you obtain the blood pressure?" (e.g., visiting nurse, automatic home BP monitor)     ----Home monitor/ work monitor    4. HISTORY: "Do you have a history of low blood pressure?" "What is your blood pressure normally?"     --------------- Denies    5. MEDICINES: "Are you taking any medications for blood pressure?" If Yes, ask: "Have they been changed recently?"     -------------Valsartan  and amlodipine  at night. ( Dosage were recently increased by MD)    6. PULSE RATE: "Do you know what your pulse rate is?"      ---------------- Denies    7. OTHER SYMPTOMS: "Have you been sick recently?" "Have you had a recent injury?"  ---------Last night and this am: Lightheaded/dizzy. ( Have now dissipated) --------- Currently feeling: Weak         New  medication: Jardance  Protocols used: Blood Pressure - Low-A-AH

## 2024-03-28 ENCOUNTER — Encounter: Payer: Self-pay | Admitting: Internal Medicine

## 2024-03-29 ENCOUNTER — Other Ambulatory Visit: Payer: Self-pay

## 2024-03-29 DIAGNOSIS — E119 Type 2 diabetes mellitus without complications: Secondary | ICD-10-CM

## 2024-04-06 ENCOUNTER — Other Ambulatory Visit: Payer: Self-pay

## 2024-04-06 MED ORDER — CHLORTHALIDONE 25 MG PO TABS: 25.0000 mg | ORAL_TABLET | Freq: Every day | ORAL | 1 refills | Status: DC

## 2024-05-11 ENCOUNTER — Encounter: Payer: Self-pay | Admitting: Internal Medicine

## 2024-05-18 ENCOUNTER — Telehealth: Payer: Self-pay

## 2024-06-08 ENCOUNTER — Ambulatory Visit: Payer: BC Managed Care – PPO | Admitting: Internal Medicine

## 2024-06-08 ENCOUNTER — Encounter: Payer: Self-pay | Admitting: Internal Medicine

## 2024-06-08 ENCOUNTER — Ambulatory Visit: Payer: Self-pay | Admitting: Internal Medicine

## 2024-06-08 VITALS — BP 118/80 | HR 95 | Temp 98.1°F | Ht 67.0 in | Wt 243.4 lb

## 2024-06-08 DIAGNOSIS — E1122 Type 2 diabetes mellitus with diabetic chronic kidney disease: Secondary | ICD-10-CM

## 2024-06-08 DIAGNOSIS — K5909 Other constipation: Secondary | ICD-10-CM | POA: Diagnosis not present

## 2024-06-08 DIAGNOSIS — Z6838 Body mass index (BMI) 38.0-38.9, adult: Secondary | ICD-10-CM

## 2024-06-08 DIAGNOSIS — M79672 Pain in left foot: Secondary | ICD-10-CM

## 2024-06-08 DIAGNOSIS — Z0000 Encounter for general adult medical examination without abnormal findings: Secondary | ICD-10-CM | POA: Diagnosis not present

## 2024-06-08 DIAGNOSIS — B353 Tinea pedis: Secondary | ICD-10-CM

## 2024-06-08 DIAGNOSIS — N1831 Chronic kidney disease, stage 3a: Secondary | ICD-10-CM

## 2024-06-08 DIAGNOSIS — E66812 Obesity, class 2: Secondary | ICD-10-CM

## 2024-06-08 DIAGNOSIS — I129 Hypertensive chronic kidney disease with stage 1 through stage 4 chronic kidney disease, or unspecified chronic kidney disease: Secondary | ICD-10-CM

## 2024-06-08 DIAGNOSIS — L600 Ingrowing nail: Secondary | ICD-10-CM

## 2024-06-08 LAB — POCT URINALYSIS DIP (CLINITEK)
Bilirubin, UA: NEGATIVE
Blood, UA: NEGATIVE
Glucose, UA: NEGATIVE mg/dL
Ketones, POC UA: NEGATIVE mg/dL
Leukocytes, UA: NEGATIVE
Nitrite, UA: NEGATIVE
POC PROTEIN,UA: NEGATIVE
Spec Grav, UA: 1.02 (ref 1.010–1.025)
Urobilinogen, UA: 0.2 U/dL
pH, UA: 6 (ref 5.0–8.0)

## 2024-06-08 NOTE — Progress Notes (Unsigned)
 I,[name], CMA,acting as a Neurosurgeon for [name], MD.,have documented all relevant documentation on the behalf of [name], MD,as directed by  [name], MD while in the presence of [name], MD.  Subjective:   Patient ID: Leonard Carter , male    DOB: December 01, 1975 , 48 y.o.   MRN: [id number]  Chief Complaint  Patient presents with   Annual Exam    Patient presents today for annual exam. He reports compliance with medications. Denies headache, chest pain & sob. He complains of bottom of his left foot pain. This has been an issue for a month. He does stand on his feet all day.  He states completing Ozempic sample given previously. He wants to know if he is to continue with this medication. He is currently out.    Diabetes   Hypertension    HPI Discussed the use of AI scribe software for clinical note transcription with the patient, who gave verbal consent to proceed.  History of Present Illness Leonard Carter is a 48 year old male with diabetes who presents for a physical, blood pressure, and diabetes follow-up.  He experiences chronic constipation, which he attributes to his previous use of Jardiance. Despite discontinuing Jardiance, constipation persists, sometimes lasting up to a week without a bowel movement. He manages this with citrate, as advised by his nephrologist, and cannot use Miralax or enemas. Linzess was initially effective but lost efficacy over time.  He is currently on Ozempic, which has suppressed his appetite and contributed to a weight loss of 22 pounds, reaching a low of 235 pounds before running out of the medication. He has since regained some weight.  He describes persistent pain in the left foot, specifically at the ball of the foot, occurring with every step. The pain is constant throughout the day and does not worsen upon waking. He has a history of a broken big toe on the same foot, with the toenail 'about to die.'  His nephrologist  discontinued Jardiance after his levels improved. He recalls elevated potassium levels, which may have influenced the decision to stop Jardiance.  No changes in urinary flow or stream. He has not received a flu shot and has not informed his eye doctor about his diabetes diagnosis, confirmed with an A1c of 6.5 in May.   Hypertension This is a chronic problem. The current episode started more than 1 year ago. The problem has been gradually improving since onset. The problem is uncontrolled. Pertinent negatives include no blurred vision, chest pain, headaches or palpitations. Past treatments include calcium channel blockers.     Past Medical History:  Diagnosis Date   Asthma    Class 3 severe obesity due to excess calories with serious comorbidity and body mass index (BMI) of 40.0 to 44.9 in adult 08/05/2016   Headache    Hypertension      Family History  Problem Relation Age of Onset   Hypertension Mother    Arthritis Mother    Kidney disease Mother    Arthritis Father    Ulcers Father    Diabetes Maternal Grandmother    Hypertension Maternal Grandmother    Dementia Maternal Grandmother    Arthritis Maternal Grandfather    Arthritis Paternal Grandmother    Ovarian cancer Paternal Grandmother      Current Outpatient Medications:    Albuterol-Budesonide (AIRSUPRA ) 90-80 MCG/ACT AERO, Inhale 2 puffs into the lungs every 6 (six) hours as needed., Disp: 32.1 g, Rfl: 1  amLODipine  (NORVASC ) 10 MG tablet, TAKE 1 TABLET(10 MG) BY MOUTH DAILY, Disp: 90 tablet, Rfl: 1   chlorthalidone  (HYGROTON ) 25 MG tablet, Take 1 tablet (25 mg total) by mouth daily., Disp: 90 tablet, Rfl: 1   traMADol  (ULTRAM ) 50 MG tablet, Take 1 tablet (50 mg total) by mouth every 6 (six) hours as needed., Disp: 20 tablet, Rfl: 0   valsartan  (DIOVAN ) 160 MG tablet, TAKE 1 TABLET(160 MG) BY MOUTH DAILY, Disp: 90 tablet, Rfl: 1   diclofenac  (VOLTAREN ) 75 MG EC tablet, Take 1 tablet (75 mg total) by mouth 2 (two) times  daily., Disp: 50 tablet, Rfl: 2   No Known Allergies   Men's preventive visit. Patient Health Questionnaire (PHQ-2) is  Flowsheet Row Office Visit from 06/08/2024 in [facility]  PHQ-2 Total Score 0  . Patient is on a *** diet. Marital status: Legally Separated. Relevant history for alcohol use is:  Social History   Substance and Sexual Activity  Alcohol Use Yes   Comment: drinks on weekends  . Relevant history for tobacco use is:  Social History   Tobacco Use  Smoking Status Every Day   Current packs/day: 0.75   Average packs/day: 0.8 packs/day for 12.0 years (9.0 ttl pk-yrs)   Types: Cigarettes  Smokeless Tobacco Never  Tobacco Comments   He is now smoking 1 pack per week  .   Review of Systems  Constitutional: Negative.   HENT: Negative.    Eyes: Negative.  Negative for blurred vision.  Respiratory: Negative.    Cardiovascular:  Negative for chest pain and palpitations.  Gastrointestinal: Negative.   Endocrine: Negative.   Genitourinary: Negative.   Musculoskeletal:  Positive for arthralgias.  Skin: Negative.   Allergic/Immunologic: Negative.   Neurological: Negative.  Negative for headaches.  Hematological: Negative.   Psychiatric/Behavioral: Negative.       Today's Vitals   06/08/24 1525  BP: 118/80  Pulse: 95  Temp: 98.1 F (36.7 C)  SpO2: 98%  Weight: 243 lb 6.4 oz (110.4 kg)  Height: 5' 7 (1.702 m)   Body mass index is 38.12 kg/m.  Wt Readings from Last 3 Encounters:  06/08/24 243 lb 6.4 oz (110.4 kg)  02/09/24 265 lb (120.2 kg)  02/02/24 265 lb 3.2 oz (120.3 kg)    Objective:  Physical Exam Vitals and nursing note reviewed.  Constitutional:      Appearance: Normal appearance. He is obese.  HENT:     Head: Normocephalic and atraumatic.     Right Ear: Tympanic membrane, ear canal and external ear normal.     Left Ear: Tympanic membrane, ear canal and external ear normal.     Nose: Nose normal.      Mouth/Throat:     Mouth: Mucous membranes are moist.     Pharynx: Oropharynx is clear.  Eyes:     Extraocular Movements: Extraocular movements intact.     Conjunctiva/sclera: Conjunctivae normal.     Pupils: Pupils are equal, round, and reactive to light.  Cardiovascular:     Rate and Rhythm: Normal rate and regular rhythm.     Pulses: Normal pulses.          Dorsalis pedis pulses are 2+ on the right side and 2+ on the left side.     Heart sounds: Normal heart sounds.  Pulmonary:     Effort: Pulmonary effort is normal.     Breath sounds: Normal breath sounds.  Abdominal:     General: Bowel  sounds are normal.     Palpations: Abdomen is soft.     Comments: Rounded, soft.   Genitourinary:    Comments: deferred Musculoskeletal:        General: Normal range of motion.     Cervical back: Normal range of motion and neck supple.  Feet:     Right foot:     Protective Sensation: 5 sites tested.  5 sites sensed.     Skin integrity: Dry skin present.     Toenail Condition: Fungal disease present.    Left foot:     Protective Sensation: 5 sites tested.  5 sites sensed.     Skin integrity: Dry skin present.     Toenail Condition: Fungal disease present. Skin:    General: Skin is warm and dry.     Comments: Scattered tattoos on UE and anteriorchest/neck  Neurological:     General: No focal deficit present.     Mental Status: He is alert and oriented to person, place, and time.  Psychiatric:        Mood and Affect: Mood normal.        Behavior: Behavior normal.         Assessment And Plan:    Annual physical exam Assessment & Plan: A full exam was performed.  DRE deferred, per patient request.  He is advised to get 30-45 minutes of regular exercise, no less than four to five days per week. Carter weight-bearing and aerobic exercises are recommended.  He is advised to follow a healthy diet with at least six fruits/veggies per day, decrease intake of red meat and other saturated fats  and to increase fish intake to twice weekly.  Meats/fish should not be fried -- baked, boiled or broiled is preferable. It is also important to cut back on your sugar intake.  Be sure to read labels - try to avoid anything with added sugar, high fructose corn syrup or other sweeteners.  If you must use a sweetener, you can try stevia or monkfruit.  It is also important to avoid artificially sweetened foods/beverages and diet drinks. Lastly, wear SPF 50 sunscreen on exposed skin and when in direct sunlight for an extended period of time.  Be sure to avoid fast food restaurants and aim for at least 60 ounces of water daily.      Orders: -     Lipid panel -     Hemoglobin A1c -     CBC -     Comprehensive metabolic panel with GFR -     Protein electrophoresis, serum -     Microalbumin / creatinine urine ratio  Type 2 diabetes mellitus with stage 3a chronic kidney disease, without long-term current use of insulin (HCC) Assessment & Plan: Chronic, diabetic foot exam was performed.  Type 2 diabetes with A1c of 6.5%. On Ozempic, aiding weight loss and appetite suppression. Discussed Ozempic's renal and cardiac benefits. Insurance coverage issues noted. Informed about the need for special eye exams. - Start Ozempic at 0.5 mg for one month. - Monitor A1c levels. - Inform eye doctor about diabetes for special eye exam. - Follow up in four months  Orders: -     POCT URINALYSIS DIP (CLINITEK) -     EKG 12-Lead -     Comprehensive metabolic panel with GFR -     Protein electrophoresis, serum -     Microalbumin / creatinine urine ratio  Hypertensive nephropathy Assessment & Plan: Chronic, fair control. Goal BP <  130/80.  EKG performed, NSR w/o acute changes.  Stage 3a CKD with previous Jardiance discontinued due to improved levels and potential potassium elevation. Discussed reintroducing similar medication for renal protection. Ozempic offers some renal protection. - Consider reintroducing Jardiance  or similar medication if appropriate.  Orders: -     POCT URINALYSIS DIP (CLINITEK) -     EKG 12-Lead -     Microalbumin / creatinine urine ratio  Chronic constipation Assessment & Plan: Chronic constipation with previous Linzess use. Current citrate use per kidney doctor. Discussed Linzess dosage adjustment to prevent dehydration and renal impact. - Provide coupon for higher dose of Linzess. - Adjust Linzess dosage to Monday, Wednesday, Friday if needed.   Left foot pain Assessment & Plan: Chronic left foot pain and right great toe nail abnormality. Possible bone spur or structural issue. Referral for further evaluation and potential x-ray needed. - Refer to foot doctor for evaluation and possible x-ray.  Orders: -     Ambulatory referral to Podiatry  Tinea pedis of Carter feet -     Ambulatory referral to Podiatry  Class 2 severe obesity due to excess calories with serious comorbidity and body mass index (BMI) of 38.0 to 38.9 in adult Assessment & Plan: He is encouraged to strive for BMI less than 30 to decrease cardiac risk. Advised to aim for at least 150 minutes of exercise per week.  Class 2 obesity with 22-pound weight loss. Goal weight under 200 pounds. Emphasized hydration, protein intake, and exercise to prevent muscle wasting on Ozempic. - Encourage hydration and adequate protein intake. - Promote regular exercise.      Return for 1 year physical, 4 month DM. Patient was given opportunity to ask questions. Patient verbalized understanding of the plan and was able to repeat key elements of the plan. All questions were answered to their satisfaction.   I, [name], MD, have reviewed all documentation for this visit. The documentation on 06/08/24 for the exam, diagnosis, procedures, and orders are all accurate and complete.

## 2024-06-09 LAB — MICROALBUMIN / CREATININE URINE RATIO
Creatinine, Urine: 196 mg/dL (ref 20–320)
Microalb Creat Ratio: 3 mg/g{creat} (ref <30)
Microalb, Ur: 0.6 mg/dL

## 2024-06-10 LAB — CBC
HCT: 41.1 % (ref 38.5–50.0)
Hemoglobin: 13.8 g/dL (ref 13.2–17.1)
MCH: 27.9 pg (ref 27.0–33.0)
MCHC: 33.6 g/dL (ref 32.0–36.0)
MCV: 83.2 fL (ref 80.0–100.0)
MPV: 11.7 fL (ref 7.5–12.5)
Platelets: 213 Thousand/uL (ref 140–400)
RBC: 4.94 Million/uL (ref 4.20–5.80)
RDW: 14.6 % (ref 11.0–15.0)
WBC: 5.5 Thousand/uL (ref 3.8–10.8)

## 2024-06-10 LAB — COMPREHENSIVE METABOLIC PANEL WITH GFR
AG Ratio: 1.8 (calc) (ref 1.0–2.5)
ALT: 20 U/L (ref 9–46)
AST: 18 U/L (ref 10–40)
Albumin: 4.3 g/dL (ref 3.6–5.1)
Alkaline phosphatase (APISO): 71 U/L (ref 36–130)
BUN/Creatinine Ratio: 14 (calc) (ref 6–22)
BUN: 25 mg/dL (ref 7–25)
CO2: 32 mmol/L (ref 20–32)
Calcium: 9.6 mg/dL (ref 8.6–10.3)
Chloride: 105 mmol/L (ref 98–110)
Creat: 1.81 mg/dL — ABNORMAL HIGH (ref 0.60–1.29)
Globulin: 2.4 g/dL (ref 1.9–3.7)
Glucose, Bld: 99 mg/dL (ref 65–99)
Potassium: 3.3 mmol/L — ABNORMAL LOW (ref 3.5–5.3)
Sodium: 141 mmol/L (ref 135–146)
Total Bilirubin: 0.3 mg/dL (ref 0.2–1.2)
Total Protein: 6.7 g/dL (ref 6.1–8.1)
eGFR: 46 mL/min/1.73m2 — ABNORMAL LOW (ref >=60)

## 2024-06-10 LAB — LIPID PANEL
Cholesterol: 197 mg/dL (ref <200)
HDL: 44 mg/dL (ref >=40)
LDL Cholesterol (Calc): 124 mg/dL — ABNORMAL HIGH
Non-HDL Cholesterol (Calc): 153 mg/dL — ABNORMAL HIGH (ref <130)
Total CHOL/HDL Ratio: 4.5 (calc) (ref <5.0)
Triglycerides: 172 mg/dL — ABNORMAL HIGH (ref <150)

## 2024-06-10 LAB — PROTEIN ELECTROPHORESIS, SERUM
Albumin ELP: 4.2 g/dL (ref 3.8–4.8)
Alpha 1: 0.3 g/dL (ref 0.2–0.3)
Alpha 2: 0.7 g/dL (ref 0.5–0.9)
Beta 2: 0.5 g/dL (ref 0.2–0.5)
Beta Globulin: 0.5 g/dL (ref 0.4–0.6)
Gamma Globulin: 1 g/dL (ref 0.8–1.7)
Total Protein: 7.2 g/dL (ref 6.1–8.1)

## 2024-06-10 LAB — HEMOGLOBIN A1C
Hgb A1c MFr Bld: 6.2 % — ABNORMAL HIGH (ref <5.7)
Mean Plasma Glucose: 131 mg/dL
eAG (mmol/L): 7.3 mmol/L

## 2024-06-15 ENCOUNTER — Ambulatory Visit (INDEPENDENT_AMBULATORY_CARE_PROVIDER_SITE_OTHER)

## 2024-06-15 ENCOUNTER — Encounter: Payer: Self-pay | Admitting: Podiatry

## 2024-06-15 ENCOUNTER — Ambulatory Visit

## 2024-06-15 DIAGNOSIS — L600 Ingrowing nail: Secondary | ICD-10-CM | POA: Diagnosis not present

## 2024-06-15 DIAGNOSIS — M722 Plantar fascial fibromatosis: Secondary | ICD-10-CM | POA: Diagnosis not present

## 2024-06-15 DIAGNOSIS — M25872 Other specified joint disorders, left ankle and foot: Secondary | ICD-10-CM | POA: Diagnosis not present

## 2024-06-15 MED ORDER — TRIAMCINOLONE ACETONIDE 10 MG/ML IJ SUSP
10.0000 mg | Freq: Once | INTRAMUSCULAR | Status: AC
  Administered 2024-06-15: 10 mg via INTRA_ARTICULAR

## 2024-06-15 MED ORDER — DICLOFENAC SODIUM 75 MG PO TBEC: 75.0000 mg | DELAYED_RELEASE_TABLET | Freq: Two times a day (BID) | ORAL | 2 refills | Status: DC

## 2024-06-15 NOTE — Patient Instructions (Signed)

## 2024-06-16 NOTE — Progress Notes (Signed)
 Subjective:   Patient ID: Leonard Carter, male   DOB: 48 y.o.   MRN: [id number]   HPI Patient presents with 2 separate problems with 1 being a damaged right big toenail that gets sore and secondarily a inflammation of the bottom of the left foot without history of injury stating it has been present for around a month.  Patient smokes less than a pack a day and tries to be active and does work at a active type job   Review of Systems  All other systems reviewed and are negative.       Objective:  Physical Exam Vitals and nursing note reviewed.  Constitutional:      Appearance: He is well-developed.  Pulmonary:     Effort: Pulmonary effort is normal.  Musculoskeletal:        General: Normal range of motion.  Skin:    General: Skin is warm.  Neurological:     Mental Status: He is alert.     Neurovascular status intact muscle strength adequate range of motion adequate with patient noted to have dystrophic severely thickened right big toenail pain when pressed with damage nailbed and on the left patient has inflammation fluid around the plantar first metatarsal around the sesamoidal complex.  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  Patient wants surgery and I explained this to him and reviewed what would be required and he read then signed consent form understanding risk and alternative treatments.  Today I anesthetized 60 mg like Marcaine mixture sterile prep done using sterile instrumentation removed the hallux nail exposed matrix applied phenol 5 applications 30 seconds followed by alcohol sterile dressing gave instructions on soaks wear dressing 24 hours take it off earlier if throbbing were  to occur and encouraged to call with any questions or concerns during healing.  For the left I went ahead and I did sterile prep and I injected the plantar capsule 3 mg dexamethasone Kenalog  5 mg Xylocaine and applied dancers pad to offload weight and will be seen back to reevaluate would make good long-term orthotic candidate  X-rays indicate there is hypertrophy of the sesamoidal complex left but no indications of fracture chronic

## 2024-06-18 DIAGNOSIS — M79672 Pain in left foot: Secondary | ICD-10-CM | POA: Insufficient documentation

## 2024-06-18 DIAGNOSIS — K5909 Other constipation: Secondary | ICD-10-CM | POA: Insufficient documentation

## 2024-06-18 NOTE — Assessment & Plan Note (Signed)
 Chronic constipation with previous Linzess use. Current citrate use per kidney doctor. Discussed Linzess dosage adjustment to prevent dehydration and renal impact. - Provide coupon for higher dose of Linzess. - Adjust Linzess dosage to Monday, Wednesday, Friday if needed.

## 2024-06-18 NOTE — Assessment & Plan Note (Addendum)
 He is encouraged to strive for BMI less than 30 to decrease cardiac risk. Advised to aim for at least 150 minutes of exercise per week.  Class 2 obesity with 22-pound weight loss. Goal weight under 200 pounds. Emphasized hydration, protein intake, and exercise to prevent muscle wasting on Ozempic. - Encourage hydration and adequate protein intake. - Promote regular exercise.

## 2024-06-18 NOTE — Assessment & Plan Note (Signed)
 Chronic left foot pain and right great toe nail abnormality. Possible bone spur or structural issue. Referral for further evaluation and potential x-ray needed. - Refer to foot doctor for evaluation and possible x-ray.

## 2024-06-18 NOTE — Assessment & Plan Note (Signed)
 Chronic, diabetic foot exam was performed.  Type 2 diabetes with A1c of 6.5%. On Ozempic, aiding weight loss and appetite suppression. Discussed Ozempic's renal and cardiac benefits. Insurance coverage issues noted. Informed about the need for special eye exams. - Start Ozempic at 0.5 mg for one month. - Monitor A1c levels. - Inform eye doctor about diabetes for special eye exam. - Follow up in four months

## 2024-06-18 NOTE — Assessment & Plan Note (Signed)
 Chronic, fair control. Goal BP <130/80.  EKG performed, NSR w/o acute changes.  Stage 3a CKD with previous Jardiance discontinued due to improved levels and potential potassium elevation. Discussed reintroducing similar medication for renal protection. Ozempic offers some renal protection. - Consider reintroducing Jardiance or similar medication if appropriate.

## 2024-06-18 NOTE — Assessment & Plan Note (Signed)

## 2024-06-19 MED ORDER — TERBINAFINE HCL 1 % EX CREA: TOPICAL_CREAM | CUTANEOUS | 0 refills | Status: AC

## 2024-06-19 NOTE — Patient Instructions (Signed)
 Health Maintenance, Male  Adopting a healthy lifestyle and getting preventive care are important in promoting health and wellness. Ask your health care provider about:  The right schedule for you to have regular tests and exams.  Things you can do on your own to prevent diseases and keep yourself healthy.  What should I know about diet, weight, and exercise?  Eat a healthy diet    Eat a diet that includes plenty of vegetables, fruits, low-fat dairy products, and lean protein.  Do not eat a lot of foods that are high in solid fats, added sugars, or sodium.  Maintain a healthy weight  Body mass index (BMI) is a measurement that can be used to identify possible weight problems. It estimates body fat based on height and weight. Your health care provider can help determine your BMI and help you achieve or maintain a healthy weight.  Get regular exercise  Get regular exercise. This is one of the most important things you can do for your health. Most adults should:  Exercise for at least 150 minutes each week. The exercise should increase your heart rate and make you sweat (moderate-intensity exercise).  Do strengthening exercises at least twice a week. This is in addition to the moderate-intensity exercise.  Spend less time sitting. Even light physical activity can be beneficial.  Watch cholesterol and blood lipids  Have your blood tested for lipids and cholesterol at 47 years of age, then have this test every 5 years.  You may need to have your cholesterol levels checked more often if:  Your lipid or cholesterol levels are high.  You are older than 47 years of age.  You are at high risk for heart disease.  What should I know about cancer screening?  Many types of cancers can be detected early and may often be prevented. Depending on your health history and family history, you may need to have cancer screening at various ages. This may include screening for:  Colorectal cancer.  Prostate cancer.  Skin cancer.  Lung  cancer.  What should I know about heart disease, diabetes, and high blood pressure?  Blood pressure and heart disease  High blood pressure causes heart disease and increases the risk of stroke. This is more likely to develop in people who have high blood pressure readings or are overweight.  Talk with your health care provider about your target blood pressure readings.  Have your blood pressure checked:  Every 3-5 years if you are 24-52 years of age.  Every year if you are 3 years old or older.  If you are between the ages of 60 and 72 and are a current or former smoker, ask your health care provider if you should have a one-time screening for abdominal aortic aneurysm (AAA).  Diabetes  Have regular diabetes screenings. This checks your fasting blood sugar level. Have the screening done:  Once every three years after age 66 if you are at a normal weight and have a low risk for diabetes.  More often and at a younger age if you are overweight or have a high risk for diabetes.  What should I know about preventing infection?  Hepatitis B  If you have a higher risk for hepatitis B, you should be screened for this virus. Talk with your health care provider to find out if you are at risk for hepatitis B infection.  Hepatitis C  Blood testing is recommended for:  Everyone born from [birth year] through 1964.  Anyone  with known risk factors for hepatitis C.  Sexually transmitted infections (STIs)  You should be screened each year for STIs, including gonorrhea and chlamydia, if:  You are sexually active and are younger than 47 years of age.  You are older than 47 years of age and your health care provider tells you that you are at risk for this type of infection.  Your sexual activity has changed since you were last screened, and you are at increased risk for chlamydia or gonorrhea. Ask your health care provider if you are at risk.  Ask your health care provider about whether you are at high risk for HIV. Your health care provider  may recommend a prescription medicine to help prevent HIV infection. If you choose to take medicine to prevent HIV, you should first get tested for HIV. You should then be tested every 3 months for as long as you are taking the medicine.  Follow these instructions at home:  Alcohol use  Do not drink alcohol if your health care provider tells you not to drink.  If you drink alcohol:  Limit how much you have to 0-2 drinks a day.  Know how much alcohol is in your drink. In the [place] one drink equals one 12 oz bottle of beer (355 mL), one 5 oz glass of wine (148 mL), or one 1 oz glass of hard liquor (44 mL).  Lifestyle  Do not use any products that contain nicotine or tobacco. These products include cigarettes, chewing tobacco, and vaping devices, such as e-cigarettes. If you need help quitting, ask your health care provider.  Do not use street drugs.  Do not share needles.  Ask your health care provider for help if you need support or information about quitting drugs.  General instructions  Schedule regular health, dental, and eye exams.  Stay current with your vaccines.  Tell your health care provider if:  You often feel depressed.  You have ever been abused or do not feel safe at home.  Summary  Adopting a healthy lifestyle and getting preventive care are important in promoting health and wellness.  Follow your health care provider's instructions about healthy diet, exercising, and getting tested or screened for diseases.  Follow your health care provider's instructions on monitoring your cholesterol and blood pressure.  This information is not intended to replace advice given to you by your health care provider. Make sure you discuss any questions you have with your health care provider.  Document Revised: 01/28/2021 Document Reviewed: 01/28/2021  Elsevier Patient Education  2024 [employer].

## 2024-06-21 ENCOUNTER — Telehealth: Payer: Self-pay

## 2024-07-25 ENCOUNTER — Encounter: Payer: Self-pay | Admitting: Internal Medicine

## 2024-07-26 ENCOUNTER — Other Ambulatory Visit: Payer: Self-pay

## 2024-07-26 ENCOUNTER — Telehealth: Payer: Self-pay

## 2024-07-26 DIAGNOSIS — N1831 Chronic kidney disease, stage 3a: Secondary | ICD-10-CM

## 2024-07-26 MED ORDER — SEMAGLUTIDE(0.25 OR 0.5MG/DOS) 2 MG/3ML ~~LOC~~ SOPN: 0.5000 mg | PEN_INJECTOR | SUBCUTANEOUS | 1 refills | Status: DC

## 2024-07-26 NOTE — Telephone Encounter (Signed)
Rx benefits verified YL,RMA

## 2024-07-29 ENCOUNTER — Other Ambulatory Visit: Payer: Self-pay | Admitting: Internal Medicine

## 2024-09-07 ENCOUNTER — Other Ambulatory Visit: Payer: Self-pay

## 2024-09-09 ENCOUNTER — Encounter: Payer: Self-pay | Admitting: Internal Medicine

## 2024-09-28 ENCOUNTER — Encounter: Payer: Self-pay | Admitting: Internal Medicine

## 2024-09-28 ENCOUNTER — Other Ambulatory Visit: Payer: Self-pay

## 2024-09-28 MED ORDER — VALSARTAN 160 MG PO TABS: ORAL_TABLET | ORAL | 1 refills | Status: DC

## 2024-09-28 MED ORDER — CHLORTHALIDONE 25 MG PO TABS: 25.0000 mg | ORAL_TABLET | Freq: Every day | ORAL | 2 refills | Status: DC

## 2024-10-12 ENCOUNTER — Ambulatory Visit: Payer: Self-pay | Admitting: Internal Medicine

## 2024-10-12 ENCOUNTER — Other Ambulatory Visit: Payer: Self-pay

## 2024-10-12 ENCOUNTER — Encounter: Payer: Self-pay | Admitting: Internal Medicine

## 2024-10-12 VITALS — BP 110/78 | HR 81 | Temp 98.1°F | Ht 67.0 in | Wt 244.0 lb

## 2024-10-12 DIAGNOSIS — K5909 Other constipation: Secondary | ICD-10-CM | POA: Diagnosis not present

## 2024-10-12 DIAGNOSIS — I129 Hypertensive chronic kidney disease with stage 1 through stage 4 chronic kidney disease, or unspecified chronic kidney disease: Secondary | ICD-10-CM

## 2024-10-12 DIAGNOSIS — N1831 Chronic kidney disease, stage 3a: Secondary | ICD-10-CM | POA: Diagnosis not present

## 2024-10-12 DIAGNOSIS — E1122 Type 2 diabetes mellitus with diabetic chronic kidney disease: Secondary | ICD-10-CM

## 2024-10-12 DIAGNOSIS — L03011 Cellulitis of right finger: Secondary | ICD-10-CM | POA: Diagnosis not present

## 2024-10-12 MED ORDER — AMLODIPINE BESYLATE 10 MG PO TABS: ORAL_TABLET | ORAL | 2 refills | Status: AC

## 2024-10-12 MED ORDER — CHLORTHALIDONE 25 MG PO TABS: 25.0000 mg | ORAL_TABLET | Freq: Every day | ORAL | 2 refills | Status: AC

## 2024-10-12 MED ORDER — SEMAGLUTIDE(0.25 OR 0.5MG/DOS) 2 MG/3ML ~~LOC~~ SOPN: 0.5000 mg | PEN_INJECTOR | SUBCUTANEOUS | 1 refills | Status: AC

## 2024-10-12 MED ORDER — VALSARTAN 160 MG PO TABS: ORAL_TABLET | ORAL | 2 refills | Status: AC

## 2024-10-12 NOTE — Assessment & Plan Note (Addendum)
 He was given samples of Linzess, 145mg  to take daily.  Previous Linzess use ineffective due to irregular intake. - Provided Linzess samples starting with medium dose, then increase to higher dose after one week. - Instructed to take Linzess daily, not as needed.

## 2024-10-12 NOTE — Progress Notes (Signed)
 I,Leonard Carter, CMA,acting as a neurosurgeon for Leonard LOISE Slocumb, MD.,have documented all relevant documentation on the behalf of Leonard LOISE Slocumb, MD,as directed by  Leonard LOISE Slocumb, MD while in the presence of Leonard LOISE Slocumb, MD.  Subjective:  Patient ID: Leonard Carter , male    DOB: Jan 08, 1976 , 49 y.o.   MRN: [id number]  Chief Complaint  Patient presents with   Diabetes    Pt presents today for bp & abnormal glucose f/u. He reports compliance with meds. He denies having any headache, chest pain & sob. He denies having any specific questions or concerns.    Hypertension    HPI Discussed the use of AI scribe software for clinical note transcription with the patient, who gave verbal consent to proceed.  History of Present Illness Leonard Carter is a 49 year old male with diabetes and hypertension who presents for a routine check-up and blood work.  He is undergoing blood work to monitor kidney function, blood count, and parathyroid hormone levels. He is confused about the timing of his physical exam, which is scheduled for September.  He has a recent issue with his right middle finger, which was swollen for three weeks and split open after being bumped at work. There was no drainage, but the finger turned black two days before the visit. He attempted to relieve pressure by inserting a pen, which did not result in drainage. The swelling has decreased, and the pain has subsided, but the skin peeled off after the finger 'bust'. He has been using Neosporin on the finger.  He experiences chronic constipation and reports that Linzess, previously tried, is no longer effective. He has been constipated more than usual and has not been taking Linzess regularly, only when needed, which may have contributed to its ineffectiveness.  He is currently taking valsartan  and Ozempic , but not amlodipine , which was stopped by a previous provider. He has switched pharmacies from [facility] to [facility] due to issues with  insurance coverage and medication costs. He has one pen of Ozempic  left, which he plans to use for the first time today.  His blood pressure has been good for the past six months, which he attributes to dietary changes and a less physically demanding job. He is less stressed and exercises about two days a week.   Hypertension This is a chronic problem. The current episode started more than 1 year ago. The problem has been gradually improving since onset. The problem is uncontrolled. Pertinent negatives include no blurred vision, chest pain, headaches or palpitations. Past treatments include calcium channel blockers.     Past Medical History:  Diagnosis Date   Asthma    Class 3 severe obesity due to excess calories with serious comorbidity and body mass index (BMI) of 40.0 to 44.9 in adult [facility]) 08/05/2016   Headache    Hypertension      Family History  Problem Relation Age of Onset   Hypertension Mother    Arthritis Mother    Kidney disease Mother    Arthritis Father    Ulcers Father    Diabetes Maternal Grandmother    Hypertension Maternal Grandmother    Dementia Maternal Grandmother    Arthritis Maternal Grandfather    Arthritis Paternal Grandmother    Ovarian cancer Paternal Grandmother      Current Outpatient Medications:    Albuterol-Budesonide (AIRSUPRA ) 90-80 MCG/ACT AERO, Inhale 2 puffs into the lungs every 6 (six) hours as needed., Disp: 32.1 g, Rfl: 1   amLODipine  (NORVASC )  10 MG tablet, TAKE 1 TABLET (10MG ) BY MOUTH DAILY., Disp: 90 tablet, Rfl: 2   chlorthalidone  (HYGROTON ) 25 MG tablet, Take 1 tablet (25 mg total) by mouth daily., Disp: 90 tablet, Rfl: 2   Semaglutide ,0.25 or 0.5MG /DOS, 2 MG/3ML SOPN, Inject 0.5 mg into the skin once a week., Disp: 3 mL, Rfl: 1   terbinafine  (LAMISIL ) 1 % cream, Apply to affected area on feet twice daily as needed (Patient not taking: Reported on 10/12/2024), Disp: 42 g, Rfl: 0   traMADol  (ULTRAM ) 50 MG tablet, Take 1 tablet (50 mg  total) by mouth every 6 (six) hours as needed. (Patient not taking: Reported on 10/12/2024), Disp: 20 tablet, Rfl: 0   valsartan  (DIOVAN ) 160 MG tablet, TAKE 1 TABLET (160)MG BY MOUTH DAILY., Disp: 90 tablet, Rfl: 2   No Known Allergies   Review of Systems  Constitutional: Negative.   Eyes:  Negative for blurred vision.  Respiratory: Negative.    Cardiovascular:  Negative for chest pain and palpitations.  Gastrointestinal: Negative.   Musculoskeletal:  Positive for arthralgias.  Skin:  Positive for color change.  Allergic/Immunologic: Negative.   Neurological:  Negative for headaches.  Hematological: Negative.      Today's Vitals   10/12/24 1550  BP: 110/78  Pulse: 81  Temp: 98.1 F (36.7 C)  SpO2: 98%  Weight: 244 lb (110.7 kg)  Height: 5' 7 (1.702 m)   Body mass index is 38.22 kg/m.  Wt Readings from Last 3 Encounters:  10/12/24 244 lb (110.7 kg)  06/08/24 243 lb 6.4 oz (110.4 kg)  02/09/24 265 lb (120.2 kg)    The 10-year ASCVD risk score (Arnett DK, et al., 2019) is: 19.1%   Values used to calculate the score:     Age: 59 years     Clinically relevant sex: Male     Is Non-Hispanic African American: Yes     Diabetic: Yes     Tobacco smoker: Yes     Systolic Blood Pressure: 110 mmHg     Is BP treated: Yes     HDL Cholesterol: 44 mg/dL     Total Cholesterol: 197 mg/dL  Objective:  Physical Exam Vitals and nursing note reviewed.  Constitutional:      Appearance: Normal appearance. He is obese.  HENT:     Head: Normocephalic and atraumatic.  Eyes:     Extraocular Movements: Extraocular movements intact.  Cardiovascular:     Rate and Rhythm: Normal rate and regular rhythm.     Heart sounds: Normal heart sounds.  Pulmonary:     Effort: Pulmonary effort is normal.     Breath sounds: Normal breath sounds.  Musculoskeletal:     Cervical back: Normal range of motion.  Skin:    General: Skin is warm.     Comments: RMF - no erythema Tenderness w/ palpation of  cuticle  Neurological:     General: No focal deficit present.     Mental Status: He is alert.  Psychiatric:        Mood and Affect: Mood normal.         Assessment And Plan:   Assessment & Plan Type 2 diabetes mellitus with stage 3a chronic kidney disease, without long-term current use of insulin (HCC) Type 2 diabetes with stage 3 CKD. Blood pressure controlled. Insurance issues with Ozempic . - Ordered renal function panel, blood count, and parathyroid hormone test. - Resent [facility]  prescription to [facility] [address] for insurance approval. - Provided Ozempic  sample  for immediate use. Hypertensive nephropathy Blood pressure well-controlled. Amlodipine  discontinued by Nephrology per patient, but remains on medication list. - Continue valsartan  and chlorthalidone  as prescribed. Chronic constipation He was given samples of Linzess, 145mg  to take daily.  Previous Linzess use ineffective due to irregular intake. - Provided Linzess samples starting with medium dose, then increase to higher dose after one week. - Instructed to take Linzess daily, not as needed. Morbid obesity (HCC) BMI 38 with underlying HTN and DM. He is encouraged to strive to lose ten percent of his body weight to decrease cardiac risk.  Paronychia of finger of right hand Paronychia likely due to cuticle infection. No tenderness or significant swelling. - Prescribed bacitracin cream to apply twice daily. - Apply warm compresses to affected areas - Instructed to monitor for improvement; if not resolved by Monday, will consider oral antibiotics.  Orders Placed This Encounter  Procedures   Hemoglobin A1c   CBC   Renal function panel   Parathyroid Hormone, Intact w/Ca   Phosphorus   Microalbumin / Creatinine Urine Ratio   Return for 4 month dm f/u.SABRA  Patient was given opportunity to ask questions. Patient verbalized understanding of the plan and was able to repeat key elements of the plan. All questions were  answered to their satisfaction.    I, Leonard LOISE Slocumb, MD, have reviewed all documentation for this visit. The documentation on 10/12/24 for the exam, diagnosis, procedures, and orders are all accurate and complete.   IF YOU HAVE BEEN REFERRED TO A SPECIALIST, IT MAY TAKE 1-2 WEEKS TO SCHEDULE/PROCESS THE REFERRAL. IF YOU HAVE NOT HEARD FROM US /SPECIALIST IN TWO WEEKS, PLEASE GIVE US  A CALL AT [phone].

## 2024-10-12 NOTE — Patient Instructions (Signed)

## 2024-10-14 LAB — MICROALBUMIN / CREATININE URINE RATIO
Creatinine, Urine: 212 mg/dL (ref 20–320)
Microalb Creat Ratio: 1 mg/g{creat}
Microalb, Ur: 0.3 mg/dL

## 2024-10-14 LAB — CBC
HCT: 40.1 % (ref 39.4–51.1)
Hemoglobin: 13.6 g/dL (ref 13.2–17.1)
MCH: 28.1 pg (ref 27.0–33.0)
MCHC: 33.9 g/dL (ref 31.6–35.4)
MCV: 82.9 fL (ref 81.4–101.7)
MPV: 12.2 fL (ref 7.5–12.5)
Platelets: 181 Thousand/uL (ref 140–400)
RBC: 4.84 Million/uL (ref 4.20–5.80)
RDW: 13.8 % (ref 11.0–15.0)
WBC: 4.3 Thousand/uL (ref 3.8–10.8)

## 2024-10-14 LAB — RENAL FUNCTION PANEL
Albumin: 4.4 g/dL (ref 3.6–5.1)
BUN/Creatinine Ratio: 15 (calc) (ref 6–22)
BUN: 28 mg/dL — ABNORMAL HIGH (ref 7–25)
CO2: 30 mmol/L (ref 20–32)
Calcium: 9.4 mg/dL (ref 8.6–10.3)
Chloride: 105 mmol/L (ref 98–110)
Creat: 1.85 mg/dL — ABNORMAL HIGH (ref 0.60–1.29)
Glucose, Bld: 88 mg/dL (ref 65–99)
Phosphorus: 3.3 mg/dL (ref 2.5–4.5)
Potassium: 3.5 mmol/L (ref 3.5–5.3)
Sodium: 140 mmol/L (ref 135–146)

## 2024-10-14 LAB — PTH, INTACT AND CALCIUM
Calcium: 9.4 mg/dL (ref 8.6–10.3)
PTH: 38 pg/mL (ref 16–77)

## 2024-10-14 LAB — HEMOGLOBIN A1C
Hgb A1c MFr Bld: 6 % — ABNORMAL HIGH
Mean Plasma Glucose: 126 mg/dL
eAG (mmol/L): 7 mmol/L

## 2024-10-18 NOTE — Assessment & Plan Note (Signed)
 Type 2 diabetes with stage 3 CKD. Blood pressure controlled. Insurance issues with Ozempic . - Ordered renal function panel, blood count, and parathyroid hormone test. - Resent [facility]pic  prescription to [facility] [address] for insurance approval. - Provided Ozempic  sample for immediate use.

## 2024-10-18 NOTE — Assessment & Plan Note (Signed)
 Blood pressure well-controlled. Amlodipine  discontinued by Nephrology per patient, but remains on medication list. - Continue valsartan  and chlorthalidone  as prescribed.

## 2025-02-09 ENCOUNTER — Ambulatory Visit: Payer: Self-pay | Admitting: Internal Medicine

## 2025-06-15 ENCOUNTER — Encounter: Payer: Self-pay | Admitting: Internal Medicine
